# Patient Record
Sex: Female | Born: 1980 | Race: White | Hispanic: No | Marital: Single | Smoking: Former smoker
Health system: Southern US, Community
[De-identification: ages and names within clinical notes are randomized; demographics above are authoritative.]

## PROBLEM LIST (undated history)

## (undated) DIAGNOSIS — R011 Cardiac murmur, unspecified: Secondary | ICD-10-CM

## (undated) DIAGNOSIS — F32A Depression, unspecified: Secondary | ICD-10-CM

## (undated) DIAGNOSIS — K219 Gastro-esophageal reflux disease without esophagitis: Secondary | ICD-10-CM

## (undated) DIAGNOSIS — Z6830 Body mass index (BMI) 30.0-30.9, adult: Secondary | ICD-10-CM

## (undated) DIAGNOSIS — F419 Anxiety disorder, unspecified: Secondary | ICD-10-CM

## (undated) DIAGNOSIS — I10 Essential (primary) hypertension: Secondary | ICD-10-CM

## (undated) DIAGNOSIS — J45909 Unspecified asthma, uncomplicated: Secondary | ICD-10-CM

## (undated) DIAGNOSIS — I219 Acute myocardial infarction, unspecified: Secondary | ICD-10-CM

## (undated) HISTORY — DX: Body mass index (BMI) 30.0-30.9, adult: Z68.30

---
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
## 2001-08-04 ENCOUNTER — Other Ambulatory Visit: Admission: RE | Admit: 2001-08-04 | Discharge: 2001-08-04 | Payer: Self-pay | Admitting: Family Medicine

## 2002-12-23 ENCOUNTER — Other Ambulatory Visit: Admission: RE | Admit: 2002-12-23 | Discharge: 2002-12-23 | Payer: Self-pay | Admitting: Gynecology

## 2004-03-27 ENCOUNTER — Emergency Department (HOSPITAL_COMMUNITY): Admission: EM | Admit: 2004-03-27 | Discharge: 2004-03-27 | Payer: Self-pay | Admitting: Emergency Medicine

## 2004-04-07 ENCOUNTER — Emergency Department (HOSPITAL_COMMUNITY): Admission: EM | Admit: 2004-04-07 | Discharge: 2004-04-07 | Payer: Self-pay

## 2004-04-08 ENCOUNTER — Emergency Department (HOSPITAL_COMMUNITY): Admission: EM | Admit: 2004-04-08 | Discharge: 2004-04-08 | Payer: Self-pay | Admitting: Emergency Medicine

## 2004-05-07 ENCOUNTER — Inpatient Hospital Stay (HOSPITAL_COMMUNITY): Admission: AD | Admit: 2004-05-07 | Discharge: 2004-05-07 | Payer: Self-pay | Admitting: *Deleted

## 2004-05-20 ENCOUNTER — Inpatient Hospital Stay (HOSPITAL_COMMUNITY): Admission: AD | Admit: 2004-05-20 | Discharge: 2004-05-20 | Payer: Self-pay | Admitting: *Deleted

## 2004-05-30 ENCOUNTER — Emergency Department (HOSPITAL_COMMUNITY): Admission: EM | Admit: 2004-05-30 | Discharge: 2004-05-30 | Payer: Self-pay | Admitting: Emergency Medicine

## 2004-06-04 ENCOUNTER — Inpatient Hospital Stay (HOSPITAL_COMMUNITY): Admission: AD | Admit: 2004-06-04 | Discharge: 2004-06-04 | Payer: Self-pay | Admitting: Family Medicine

## 2004-06-06 ENCOUNTER — Inpatient Hospital Stay (HOSPITAL_COMMUNITY): Admission: AD | Admit: 2004-06-06 | Discharge: 2004-06-06 | Payer: Self-pay | Admitting: *Deleted

## 2004-06-10 ENCOUNTER — Inpatient Hospital Stay (HOSPITAL_COMMUNITY): Admission: EM | Admit: 2004-06-10 | Discharge: 2004-06-12 | Payer: Self-pay | Admitting: Emergency Medicine

## 2004-06-10 ENCOUNTER — Emergency Department (HOSPITAL_COMMUNITY): Admission: EM | Admit: 2004-06-10 | Discharge: 2004-06-10 | Payer: Self-pay | Admitting: Emergency Medicine

## 2004-06-12 ENCOUNTER — Other Ambulatory Visit: Admission: RE | Admit: 2004-06-12 | Discharge: 2004-06-12 | Payer: Self-pay | Admitting: Obstetrics & Gynecology

## 2004-09-27 ENCOUNTER — Inpatient Hospital Stay (HOSPITAL_COMMUNITY): Admission: AD | Admit: 2004-09-27 | Discharge: 2004-09-27 | Payer: Self-pay | Admitting: Obstetrics and Gynecology

## 2004-09-27 ENCOUNTER — Other Ambulatory Visit: Admission: RE | Admit: 2004-09-27 | Discharge: 2004-09-27 | Payer: Self-pay | Admitting: Obstetrics and Gynecology

## 2004-12-12 ENCOUNTER — Inpatient Hospital Stay (HOSPITAL_COMMUNITY): Admission: AD | Admit: 2004-12-12 | Discharge: 2004-12-15 | Payer: Self-pay | Admitting: Obstetrics & Gynecology

## 2004-12-13 ENCOUNTER — Encounter (INDEPENDENT_AMBULATORY_CARE_PROVIDER_SITE_OTHER): Payer: Self-pay | Admitting: Specialist

## 2005-10-31 ENCOUNTER — Other Ambulatory Visit: Admission: RE | Admit: 2005-10-31 | Discharge: 2005-10-31 | Payer: Self-pay | Admitting: Obstetrics and Gynecology

## 2006-12-13 ENCOUNTER — Emergency Department (HOSPITAL_COMMUNITY): Admission: EM | Admit: 2006-12-13 | Discharge: 2006-12-13 | Payer: Self-pay | Admitting: Emergency Medicine

## 2009-07-20 ENCOUNTER — Emergency Department (HOSPITAL_COMMUNITY): Admission: EM | Admit: 2009-07-20 | Discharge: 2009-07-20 | Payer: Self-pay | Admitting: Family Medicine

## 2010-09-22 NOTE — Discharge Summary (Signed)
NAME:  Kristi Chambers, Kristi Chambers NO.:  [id number]   MEDICAL RECORD NO.:  [id number]          PATIENT TYPE:  INP   LOCATION:  0372                         FACILITY:  [facility]   PHYSICIAN:  Lonia Blood, M.D.      DATE OF BIRTH:  [birth year]   DATE OF ADMISSION:  06/10/2004  DATE OF DISCHARGE:  06/12/2004                                 DISCHARGE SUMMARY   PRIMARY CARE PHYSICIAN:  The patient is unassigned.   OBSTETRICIAN:  Randye Lobo, M.D.   DISCHARGE DIAGNOSES:  1.  Acute asthmatic exacerbation.  2.  Gastroesophageal reflux disease.  3.  Seasonal allergies.  4.  Complicated second trimester pregnancy with partial placenta previa.   DISCHARGE MEDICATIONS:  1.  Claritin 10 mg p.o. daily.  2.  Steroid taper over the next 10 days.  3.  Prenatal vitamins.  4.  Protonix 40 mg daily.  5.  Advair Diskus 100/50 one puff b.i.d.  6.  Albuterol MDI two puffs b.i.d. p.r.n.   PROCEDURES PERFORMED:  Chest x-ray performed on June 11, 2004 that shows  peribronchial thickening and increased interstitial markings which may  suggest bronchitis or reactive airway disease, but no focal infiltrates.   CONSULTATIONS:  OB-GYN was consulted.   BRIEF HISTORY AND PHYSICAL:  Please refer to dictated history and physical  by Dr. Delilah Shan; In short, however, this is a pleasant 30 year old female  with history of asthma who is also [redacted] weeks pregnant, presenting with acute  asthmatic exacerbation.  The patient's condition is also complicated by the  fact that her pregnancy has recently been found to be complicated with early  and mild placenta previa.  The patient was supposed to be on strict bed  rest.  The patient has been taking only albuterol p.r.n.  She could not  afford previous Advair.  In the emergency room, she was found to be  tachycardic with wheezing significantly bilaterally, as well as tight air  entry.  The patient was also found to be a little bit anxious.  Her labs for  the  most part were stable except for some mild leukocytosis.  She was  subsequently admitted for management of acute asthma exacerbation.   HOSPITAL COURSE:  Problem 1. Acute asthmatic exacerbation.  The patient was  admitted for close observation, started on oxygen via nasal cannula keeping  her saturations between 90% and 93%.  She was also initiated on IV steroids,  in this case Solu-Medrol, which was later changed to prednisone prior to  discharge.  The patient also received some nebulized albuterol and Ativan.  In addition, also she was covered for GERD as well as seasonal allergies.  Extensive discussion with the patient and her husband also was done,  especially since the patient currently lives in a trailer home with  apparently old and new tobacco smoking roommate in the house.  Her partner  and her were informed about the dangers of living in such a vicinity.  As  such, her asthma may have been exacerbated by her environment and the fact  that she has seasonal allergies.  She has  therefore been advised to continue  taking those medications including the Claritin.   Problem 2. Possible gastroesophageal reflux disease.  As indicated, the  patient was placed on Protonix as she will continue to take Protonix at  home.   Problem 3. Seasonal allergies.  The patient will also continue to take  Claritin which was prescribed here in the hospital and she was given a  prescription at home.   Problem 4. Complicated pregnancy.  The patient is on complete bed rest which  was continued in the hospital.  She is to see her obstetrician today after  discharge and to continue with care by Dr. Lyman Bishop.      LG/MEDQ  D:  06/12/2004  T:  06/12/2004  Job:  [id number]

## 2010-09-22 NOTE — H&P (Signed)
NAME:  Kristi Chambers, Kristi Chambers NO.:  [id number]   MEDICAL RECORD NO.:  [id number]          PATIENT TYPE:  EMS   LOCATION:  ED                           FACILITY:  [facility]   PHYSICIAN:  Gertha Calkin, M.D.DATE OF BIRTH:  09-17-1980   DATE OF ADMISSION:  06/10/2004  DATE OF DISCHARGE:                                HISTORY & PHYSICAL   PRIMARY CARE PHYSICIAN:  Unassigned.   PRIMARY OBSTETRICIAN/GYNECOLOGIST:  Randye Lobo, M.D.   CHIEF COMPLAINT:  Acute shortness of breath.   HISTORY OF PRESENT ILLNESS:  This is a pleasant 30 year old Caucasian female  with asthma and [redacted] weeks pregnant who presents with an asthma exacerbation  and was seen earlier today and sent home after receiving a good response  from nebulized breathing treatments and p.o. steroids.  She states that once  she got home within 15-30 minutes she had another exacerbation that did not  respond to the inhaled steroid that she has (albuterol).  She states that  recently she has been having worsening exacerbations.  She does state that  she has recently moved in the past few weeks into a new trailer home whose  previous occupants were heavy smokers.  Otherwise she denies any fevers,  chills, cough productive of sputum.  No sick contacts.   PAST MEDICAL HISTORY:  1.  Questionable history of  reflux disease but on no medications currently.  2.  She also has known asthma.   No surgical history.   FAMILY HISTORY:  No history of DVTs or PEs.  No cardiac issues.  No history  of asthma or cancers.   MEDICATIONS:  Was on Advair but has not taken it for several months, due to  insurance issues.  Now is only taking albuterol on a p.r.n. basis and a  prenatal vitamin.   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   REVIEW OF SYSTEMS:  Negative except other than the HPI.   PHYSICAL EXAMINATION:  VITAL SIGNS:  Temperature  is 98.0, blood pressure  115/66, heart rate of 120, respirations of 26.  She is 92% on room air,  currently on 2 liters nasal cannula she is 98%.  HEENT:  Unremarkable.  CARDIOVASCULAR:  Regular rate and rhythm.  No murmurs, rubs or gallops.  CHEST:  Significant wheezing with tight air sounds.  There is no accessory  muscle use.  She is able to complete full sentences.  (She has never been on  a ventilator, never been hospitalized for pneumonia).  ABDOMEN:  She is distended.  Bowel sounds are present.  No tenderness.  EXTREMITIES:  Without clubbing, cyanosis, or edema.   LABS:  CBC, C-MET, and D-dimmer are pending.  No x-ray done.   ASSESSMENT:  1.  Asthma exacerbation.  2.  Complicated pregnancy (partial placenta previa).  3.  Seasonal allergies.  4.  Deep vein thrombosis prophylaxis.   PLAN:  At this point, given that she has returned to the ED for a second  time in one day  with the same chief complaint, we will admit and use IV  steroids, use a multi-faceted approach to try to treat possible underlying  triggers for her worsening asthma which include increased risk of reflux  secondary to her pregnancy, also new environmental triggers which may be  worsening her usual allergic symptoms, therefore, we will place her on a  PPI, antihistamines, and do scheduled breathing treatments.  Dr. Freida Busman had  spoken with Dr. Edward Jolly who is her OB/GYN, and the physician stated that there  is nothing that needs to be added to her treatment for the asthma  exacerbation and the only thing to check would be a q.a.m. fetal heart  tones.  The OB/GYN physician has stated, per Dr. Freida Busman, that will be  available for any acute issues.  I will be admitting her to a tele bed and  place her on a fetal heart monitor.  IV steroids as stated above.  If there  is any infiltrate noted on x-ray,  we will start her on antibiotics,  otherwise we will treat as an asthma exacerbation.      JD/MEDQ  D:  06/10/2004   T:  06/11/2004  Job:  [id number]   cc:   Randye Lobo, M.D.  [address]  [place]  [place] [address]  Fax: [phone]

## 2011-01-11 ENCOUNTER — Inpatient Hospital Stay (INDEPENDENT_AMBULATORY_CARE_PROVIDER_SITE_OTHER)
Admission: RE | Admit: 2011-01-11 | Discharge: 2011-01-11 | Disposition: A | Discharge status: Home / Self Care | Payer: Self-pay | Source: Ambulatory Visit | Attending: Emergency Medicine | Admitting: Emergency Medicine

## 2011-01-11 DIAGNOSIS — J019 Acute sinusitis, unspecified: Secondary | ICD-10-CM

## 2012-10-03 ENCOUNTER — Emergency Department (HOSPITAL_COMMUNITY)
Admission: EM | Admit: 2012-10-03 | Discharge: 2012-10-03 | Disposition: A | Discharge status: Home / Self Care | Payer: Self-pay | Attending: Emergency Medicine | Admitting: Emergency Medicine

## 2012-10-03 ENCOUNTER — Encounter (HOSPITAL_COMMUNITY): Payer: Self-pay | Admitting: Emergency Medicine

## 2012-10-03 DIAGNOSIS — K648 Other hemorrhoids: Secondary | ICD-10-CM | POA: Insufficient documentation

## 2012-10-03 DIAGNOSIS — K921 Melena: Secondary | ICD-10-CM | POA: Insufficient documentation

## 2012-10-03 DIAGNOSIS — Z79899 Other long term (current) drug therapy: Secondary | ICD-10-CM | POA: Insufficient documentation

## 2012-10-03 DIAGNOSIS — R198 Other specified symptoms and signs involving the digestive system and abdomen: Secondary | ICD-10-CM

## 2012-10-03 DIAGNOSIS — K649 Unspecified hemorrhoids: Secondary | ICD-10-CM

## 2012-10-03 DIAGNOSIS — K644 Residual hemorrhoidal skin tags: Secondary | ICD-10-CM | POA: Insufficient documentation

## 2012-10-03 DIAGNOSIS — K625 Hemorrhage of anus and rectum: Secondary | ICD-10-CM | POA: Insufficient documentation

## 2012-10-03 DIAGNOSIS — K6389 Other specified diseases of intestine: Secondary | ICD-10-CM | POA: Insufficient documentation

## 2012-10-03 LAB — CBC WITH DIFFERENTIAL/PLATELET
Basophils Absolute: 0 10*3/uL (ref 0.0–0.1)
Basophils Relative: 0 % (ref 0–1)
Eosinophils Relative: 5 % (ref 0–5)
HCT: 37.6 % (ref 36.0–46.0)
MCHC: 35.4 g/dL (ref 30.0–36.0)
MCV: 87.9 fL (ref 78.0–100.0)
Monocytes Absolute: 0.6 10*3/uL (ref 0.1–1.0)
RDW: 12.3 % (ref 11.5–15.5)

## 2012-10-03 LAB — BASIC METABOLIC PANEL
Calcium: 9 mg/dL (ref 8.4–10.5)
Creatinine, Ser: 0.56 mg/dL (ref 0.50–1.10)
GFR calc Af Amer: >90 mL/min (ref >90)

## 2012-10-03 LAB — OCCULT BLOOD, POC DEVICE: Fecal Occult Bld: POSITIVE — AB

## 2012-10-03 MED ORDER — PANTOPRAZOLE SODIUM 20 MG PO TBEC: 20.0000 mg | DELAYED_RELEASE_TABLET | Freq: Every day | ORAL | Status: DC

## 2012-10-03 MED ORDER — PANTOPRAZOLE SODIUM 40 MG IV SOLR
40.0000 mg | Freq: Once | INTRAVENOUS | Status: AC
  Administered 2012-10-03: 40 mg via INTRAVENOUS
  Filled 2012-10-03: qty 40

## 2012-10-03 MED ORDER — SODIUM CHLORIDE 0.9 % IV BOLUS (SEPSIS)
1000.0000 mL | Freq: Once | INTRAVENOUS | Status: AC
  Administered 2012-10-03: 1000 mL via INTRAVENOUS

## 2012-10-03 NOTE — ED Provider Notes (Signed)
Medical screening examination/treatment/procedure(s) were performed by non-physician practitioner and as supervising physician I was immediately available for consultation/collaboration.    Celene Kras, MD 10/03/12 [phone]

## 2012-10-03 NOTE — ED Notes (Signed)
Pt states that she has had rectal bleeding since mon dripping amount, no abd pain just pressure, has not had a normal bm,

## 2012-10-03 NOTE — ED Provider Notes (Signed)
History     CSN: [id number]  Arrival date & time 10/03/12  1035   First MD Initiated Contact with Patient 10/03/12 1134      Chief Complaint  Patient presents with  . Rectal Bleeding    (Consider location/radiation/quality/duration/timing/severity/associated sxs/prior treatment) HPI Comments: Pt presents to the ED for rectal bleeding.  [place] and noticed a small amount of blood in her stool.  She has had this happen previously without complications so she did not seek medical attention.  Over the past 2 days she has had increased blood from her rectum without passage of BM.  States she feels it is "pouring out her".  BMs are always irregular- no BM since Monday but denies any straining.  Mother has dx of IBS. Remote hx of small external hemorrhoids.  Has never seen GI in the past.  No hx of anemia.  Denies any chest pain, palpitations, nausea, vomiting, diarrhea, weakness, or dizziness.  Has not tried any meds to help with BMs.  The history is provided by the patient.    History reviewed. No pertinent past medical history.  History reviewed. No pertinent past surgical history.  No family history on file.  History  Substance Use Topics  . Smoking status: Not on file  . Smokeless tobacco: Not on file  . Alcohol Use: Not on file    OB History   Grav Para Term Preterm Abortions TAB SAB Ect Mult Living                  Review of Systems  Gastrointestinal: Positive for blood in stool.  All other systems reviewed and are negative.    Allergies  Review of patient's allergies indicates no known allergies.  Home Medications   Current Outpatient Rx  Name  Route  Sig  Dispense  Refill  . albuterol (PROVENTIL HFA;VENTOLIN HFA) 108 (90 BASE) MCG/ACT inhaler   Inhalation   Inhale 2 puffs into the lungs every 6 (six) hours as needed for wheezing or shortness of breath.         . Aspirin-Salicylamide-Caffeine (BC  HEADACHE POWDER PO)   Oral   Take 1 packet by mouth daily as needed (for headache).         . Multiple Vitamin (MULTIVITAMIN WITH MINERALS) TABS   Oral   Take 1 tablet by mouth daily.         Marland Kitchen venlafaxine XR (EFFEXOR-XR) 150 MG 24 hr capsule   Oral   Take 150 mg by mouth at bedtime.           BP 136/68  Pulse 91  Temp(Src) 98.6 F (37 C) (Oral)  Resp 16  Ht 5\' 7"  (1.702 m)  Wt 162 lb (73.483 kg)  BMI 25.37 kg/m2  SpO2 99%  Physical Exam  Nursing note and vitals reviewed. Constitutional: She is oriented to person, place, and time. She appears well-developed and well-nourished. No distress.  HENT:  Head: Normocephalic and atraumatic.  Mouth/Throat: Oropharynx is clear and moist.  Eyes: Conjunctivae and EOM are normal.  Neck: Normal range of motion. Neck supple.  Cardiovascular: Normal rate, regular rhythm and normal heart sounds.   Pulmonary/Chest: Effort normal and breath sounds normal. No respiratory distress.  Abdominal: Soft. Bowel sounds are normal. There is no tenderness. There is no guarding.  Genitourinary: Rectal exam shows external hemorrhoid, internal hemorrhoid and tenderness. Rectal exam shows no fissure, no mass  and anal tone normal. Guaiac positive stool.  Non-thrombosed external hemorrhoids surrounding rectum, internal hemorrhoids TTP, small amount of blood noted, normal rectal tone  Musculoskeletal: Normal range of motion.  Neurological: She is alert and oriented to person, place, and time. She has normal strength. No cranial nerve deficit or sensory deficit. Gait normal.  Skin: Skin is warm and dry. She is not diaphoretic.  Psychiatric: She has a normal mood and affect.    ED Course  Procedures (including critical care time)  Labs Reviewed  OCCULT BLOOD, POC DEVICE - Abnormal; Notable for the following:    Fecal Occult Bld POSITIVE (*)    All other components within normal limits  CBC WITH DIFFERENTIAL  BASIC METABOLIC PANEL   No results  found.   1. Rectal bleeding   2. Irregular bowel habits   3. Hemorrhoids       MDM   32 y.o. F presenting to the ED for rectal bleeding.  States irregular BMs as long as she can remember.  Bleeding increased over the past 2 days.  No weakness, palpitations, or dizziness.  Guaiac positive.  H/H stable- no signs/sx of anemia.  DRE with external and internal hemorrhoids- possible source of bleeding.  IVF and protonix given in the ED.  Rx protonix. May wish to try OTC stool softener to help pass BM. FU with GI.  Discussed plan with pt, she agreed.  Return precautions advised.        Garlon Hatchet, PA-C 10/03/12 1420  Garlon Hatchet, PA-C 10/03/12 1423

## 2013-06-08 ENCOUNTER — Emergency Department (HOSPITAL_COMMUNITY): Payer: Self-pay

## 2013-06-08 ENCOUNTER — Encounter (HOSPITAL_COMMUNITY): Payer: Self-pay | Admitting: Emergency Medicine

## 2013-06-08 ENCOUNTER — Emergency Department (HOSPITAL_COMMUNITY)
Admission: EM | Admit: 2013-06-08 | Discharge: 2013-06-08 | Disposition: A | Discharge status: Home / Self Care | Payer: Self-pay | Attending: Emergency Medicine | Admitting: Emergency Medicine

## 2013-06-08 DIAGNOSIS — F17200 Nicotine dependence, unspecified, uncomplicated: Secondary | ICD-10-CM | POA: Insufficient documentation

## 2013-06-08 DIAGNOSIS — IMO0002 Reserved for concepts with insufficient information to code with codable children: Secondary | ICD-10-CM | POA: Insufficient documentation

## 2013-06-08 DIAGNOSIS — Z79899 Other long term (current) drug therapy: Secondary | ICD-10-CM | POA: Insufficient documentation

## 2013-06-08 DIAGNOSIS — J45901 Unspecified asthma with (acute) exacerbation: Secondary | ICD-10-CM | POA: Insufficient documentation

## 2013-06-08 HISTORY — DX: Unspecified asthma, uncomplicated: J45.909

## 2013-06-08 LAB — CBC
HEMATOCRIT: 37.2 % (ref 36.0–46.0)
HEMOGLOBIN: 13.1 g/dL (ref 12.0–15.0)
MCH: 31 pg (ref 26.0–34.0)
MCHC: 35.2 g/dL (ref 30.0–36.0)
MCV: 88.2 fL (ref 78.0–100.0)
Platelets: 277 10*3/uL (ref 150–400)
RBC: 4.22 MIL/uL (ref 3.87–5.11)
RDW: 12.3 % (ref 11.5–15.5)
WBC: 8.9 10*3/uL (ref 4.0–10.5)

## 2013-06-08 LAB — BASIC METABOLIC PANEL
BUN: 9 mg/dL (ref 6–23)
CHLORIDE: 101 meq/L (ref 96–112)
CO2: 26 meq/L (ref 19–32)
Calcium: 9.8 mg/dL (ref 8.4–10.5)
Creatinine, Ser: 0.78 mg/dL (ref 0.50–1.10)
GFR calc Af Amer: >90 mL/min (ref >90)
GLUCOSE: 93 mg/dL (ref 70–99)
POTASSIUM: 4.8 meq/L (ref 3.7–5.3)
SODIUM: 139 meq/L (ref 137–147)

## 2013-06-08 MED ORDER — IPRATROPIUM BROMIDE 0.02 % IN SOLN
0.5000 mg | Freq: Once | RESPIRATORY_TRACT | Status: AC
  Administered 2013-06-08: 0.5 mg via RESPIRATORY_TRACT
  Filled 2013-06-08: qty 2.5

## 2013-06-08 MED ORDER — ALBUTEROL SULFATE HFA 108 (90 BASE) MCG/ACT IN AERS
2.0000 | INHALATION_SPRAY | Freq: Once | RESPIRATORY_TRACT | Status: AC
  Administered 2013-06-08: 2 via RESPIRATORY_TRACT
  Filled 2013-06-08: qty 6.7

## 2013-06-08 MED ORDER — PREDNISONE 10 MG PO TABS: ORAL_TABLET | ORAL | Status: DC

## 2013-06-08 MED ORDER — PREDNISONE 20 MG PO TABS
60.0000 mg | ORAL_TABLET | Freq: Once | ORAL | Status: AC
  Administered 2013-06-08: 60 mg via ORAL
  Filled 2013-06-08: qty 3

## 2013-06-08 MED ORDER — ALBUTEROL SULFATE (2.5 MG/3ML) 0.083% IN NEBU
5.0000 mg | INHALATION_SOLUTION | Freq: Once | RESPIRATORY_TRACT | Status: AC
  Administered 2013-06-08: 5 mg via RESPIRATORY_TRACT
  Filled 2013-06-08: qty 6

## 2013-06-08 NOTE — ED Notes (Signed)
Pt ambulates without distress. Respirations easy non labored. 

## 2013-06-08 NOTE — ED Notes (Signed)
Pt reports having trouble breathing for 2 months. Pt states that she feels she is wheezing and that her chest is tight. Pt has used plumicort and albuterol to help treat symptoms. Recently used meds before coming to ED with some relief. Sats 96% on RA. No wheezing heard in lung fields.

## 2013-06-08 NOTE — Discharge Instructions (Signed)
Take prednisone as prescribed until all gone. Use inhaler, two puffs every 4 hrs as needed. Avoid triggers. Follow up with a primary care doctor.     Asthma, Adult Asthma is a recurring condition in which the airways tighten and narrow. Asthma can make it difficult to breathe. It can cause coughing, wheezing, and shortness of breath. Asthma episodes (also called asthma attacks) range from minor to life-threatening. Asthma cannot be cured, but medicines and lifestyle changes can help control it. CAUSES Asthma is believed to be caused by inherited (genetic) and environmental factors, but its exact cause is unknown. Asthma may be triggered by allergens, lung infections, or irritants in the air. Asthma triggers are different for each person. Common triggers include:   Animal dander.  Dust mites.  Cockroaches.  Pollen from trees or grass.  Mold.  Smoke.  Air pollutants such as dust, household cleaners, hair sprays, aerosol sprays, paint fumes, strong chemicals, or strong odors.  Cold air, weather changes, and winds (which increase molds and pollens in the air).  Strong emotional expressions such as crying or laughing hard.  Stress.  Certain medicines (such as aspirin) or types of drugs (such as beta-blockers).  Sulfites in foods and drinks. Foods and drinks that may contain sulfites include dried fruit, potato chips, and sparkling grape juice.  Infections or inflammatory conditions such as the flu, a cold, or an inflammation of the nasal membranes (rhinitis).  Gastroesophageal reflux disease (GERD).  Exercise or strenuous activity. SYMPTOMS Symptoms may occur immediately after asthma is triggered or many hours later. Symptoms include:  Wheezing.  Excessive nighttime or early morning coughing.  Frequent or severe coughing with a common cold.  Chest tightness.  Shortness of breath. DIAGNOSIS  The diagnosis of asthma is made by a review of your medical history and a  physical exam. Tests may also be performed. These may include:  Lung function studies. These tests show how much air you breath in and out.  Allergy tests.  Imaging tests such as X-rays. TREATMENT  Asthma cannot be cured, but it can usually be controlled. Treatment involves identifying and avoiding your asthma triggers. It also involves medicines. There are 2 classes of medicine used for asthma treatment:   Controller medicines. These prevent asthma symptoms from occurring. They are usually taken every day.  Reliever or rescue medicines. These quickly relieve asthma symptoms. They are used as needed and provide short-term relief. Your health care provider will help you create an asthma action plan. An asthma action plan is a written plan for managing and treating your asthma attacks. It includes a list of your asthma triggers and how they may be avoided. It also includes information on when medicines should be taken and when their dosage should be changed. An action plan may also involve the use of a device called a peak flow meter. A peak flow meter measures how well the lungs are working. It helps you monitor your condition. HOME CARE INSTRUCTIONS   Take medicine as directed by your health care provider. Speak with your health care provider if you have questions about how or when to take the medicines.  Use a peak flow meter as directed by your health care provider. Record and keep track of readings.  Understand and use the action plan to help minimize or stop an asthma attack without needing to seek medical care.  Control your home environment in the following ways to help prevent asthma attacks:  Do not smoke. Avoid being exposed to  secondhand smoke.  Change your heating and air conditioning filter regularly.  Limit your use of fireplaces and wood stoves.  Get rid of pests (such as roaches and mice) and their droppings.  Throw away plants if you see mold on them.  Clean your  floors and dust regularly. Use unscented cleaning products.  Try to have someone else vacuum for you regularly. Stay out of rooms while they are being vacuumed and for a short while afterward. If you vacuum, use a dust mask from a hardware store, a double-layered or microfilter vacuum cleaner bag, or a vacuum cleaner with a HEPA filter.  Replace carpet with wood, tile, or vinyl flooring. Carpet can trap dander and dust.  Use allergy-proof pillows, mattress covers, and box spring covers.  Wash bed sheets and blankets every week in hot water and dry them in a dryer.  Use blankets that are made of polyester or cotton.  Clean bathrooms and kitchens with bleach. If possible, have someone repaint the walls in these rooms with mold-resistant paint. Keep out of the rooms that are being cleaned and painted.  Wash hands frequently. SEEK MEDICAL CARE IF:   You have wheezing, shortness of breath, or a cough even if taking medicine to prevent attacks.  The colored mucus you cough up (sputum) is thicker than usual.  Your sputum changes from clear or white to yellow, green, gray, or bloody.  You have any problems that may be related to the medicines you are taking (such as a rash, itching, swelling, or trouble breathing).  You are using a reliever medicine more than 2 3 times per week.  Your peak flow is still at 50 79% of you personal best after following your action plan for 1 hour. SEEK IMMEDIATE MEDICAL CARE IF:   You seem to be getting worse and are unresponsive to treatment during an asthma attack.  You are short of breath even at rest.  You get short of breath when doing very little physical activity.  You have difficulty eating, drinking, or talking due to asthma symptoms.  You develop chest pain.  You develop a fast heartbeat.  You have a bluish color to your lips or fingernails.  You are lightheaded, dizzy, or faint.  Your peak flow is less than 50% of your personal  best.  You have a fever or persistent symptoms for more than 2 3 days.  You have a fever and symptoms suddenly get worse. MAKE SURE YOU:   Understand these instructions.  Will watch your condition.  Will get help right away if you are not doing well or get worse. Document Released: 04/23/2005 Document Revised: 12/24/2012 Document Reviewed: 11/20/2012 [facility] Patient Information 2014 [place], [place].  Asthma Attack Prevention Although there is no way to prevent asthma from starting, you can take steps to control the disease and reduce its symptoms. Learn about your asthma and how to control it. Take an active role to control your asthma by working with your health care provider to create and follow an asthma action plan. An asthma action plan guides you in:  Taking your medicines properly.  Avoiding things that set off your asthma or make your asthma worse (asthma triggers).  Tracking your level of asthma control.  Responding to worsening asthma.  Seeking emergency care when needed. To track your asthma, keep records of your symptoms, check your peak flow number using a handheld device that shows how well air moves out of your lungs (peak flow meter), and get  regular asthma checkups.  WHAT ARE SOME WAYS TO PREVENT AN ASTHMA ATTACK?  Take medicines as directed by your health care provider.  Keep track of your asthma symptoms and level of control.  With your health care provider, write a detailed plan for taking medicines and managing an asthma attack. Then be sure to follow your action plan. Asthma is an ongoing condition that needs regular monitoring and treatment.  Identify and avoid asthma triggers. Many outdoor allergens and irritants (such as pollen, mold, cold air, and air pollution) can trigger asthma attacks. Find out what your asthma triggers are and take steps to avoid them.  Monitor your breathing. Learn to recognize warning signs of an attack, such as coughing,  wheezing, or shortness of breath. Your lung function may decrease before you notice any signs or symptoms, so regularly measure and record your peak airflow with a home peak flow meter.  Identify and treat attacks early. If you act quickly, you are less likely to have a severe attack. You will also need less medicine to control your symptoms. When your peak flow measurements decrease and alert you to an upcoming attack, take your medicine as instructed and immediately stop any activity that may have triggered the attack. If your symptoms do not improve, get medical help.  Pay attention to increasing quick-relief inhaler use. If you find yourself relying on your quick-relief inhaler, your asthma is not under control. See your health care provider about adjusting your treatment. WHAT CAN MAKE MY SYMPTOMS WORSE? A number of common things can set off or make your asthma symptoms worse and cause temporary increased inflammation of your airways. Keep track of your asthma symptoms for several weeks, detailing all the environmental and emotional factors that are linked with your asthma. When you have an asthma attack, go back to your asthma diary to see which factor, or combination of factors, might have contributed to it. Once you know what these factors are, you can take steps to control many of them. If you have allergies and asthma, it is important to take asthma prevention steps at home. Minimizing contact with the substance to which you are allergic will help prevent an asthma attack. Some triggers and ways to avoid these triggers are: Animal Dander:  Some people are allergic to the flakes of skin or dried saliva from animals with fur or feathers.   There is no such thing as a hypoallergenic dog or cat breed. All dogs or cats can cause allergies, even if they don't shed.  Keep these pets out of your home.  If you are not able to keep a pet outdoors, keep the pet out of your bedroom and other sleeping  areas at all times, and keep the door closed.  Remove carpets and furniture covered with cloth from your home. If that is not possible, keep the pet away from fabric-covered furniture and carpets. Dust Mites: Many people with asthma are allergic to dust mites. Dust mites are tiny bugs that are found in every home in mattresses, pillows, carpets, fabric-covered furniture, bedcovers, clothes, stuffed toys, and other fabric-covered items.   Cover your mattress in a special dust-proof cover.  Cover your pillow in a special dust-proof cover, or wash the pillow each week in hot water. Water must be hotter than 130 F (54.4 C) to kill dust mites. Cold or warm water used with detergent and bleach can also be effective.  Wash the sheets and blankets on your bed each week in hot  water.  Try not to sleep or lie on cloth-covered cushions.  Call ahead when traveling and ask for a smoke-free hotel room. Bring your own bedding and pillows in case the hotel only supplies feather pillows and down comforters, which may contain dust mites and cause asthma symptoms.  Remove carpets from your bedroom and those laid on concrete, if you can.  Keep stuffed toys out of the bed, or wash the toys weekly in hot water or cooler water with detergent and bleach. Cockroaches: Many people with asthma are allergic to the droppings and remains of cockroaches.   Keep food and garbage in closed containers. Never leave food out.  Use poison baits, traps, powders, gels, or paste (for example, boric acid).  If a spray is used to kill cockroaches, stay out of the room until the odor goes away. Indoor Mold:  Fix leaky faucets, pipes, or other sources of water that have mold around them.  Clean floors and moldy surfaces with a fungicide or diluted bleach.  Avoid using humidifiers, vaporizers, or swamp coolers. These can spread molds through the air. Pollen and Outdoor Mold:  When pollen or mold spore counts are high, try  to keep your windows closed.  Stay indoors with windows closed from late morning to afternoon. Pollen and some mold spore counts are highest at that time.  Ask your health care provider whether you need to take anti-inflammatory medicine or increase your dose of the medicine before your allergy season starts. Other Irritants to Avoid:  Tobacco smoke is an irritant. If you smoke, ask your health care provider how you can quit. Ask family members to quit smoking too. Do not allow smoking in your home or car.  If possible, do not use a wood-burning stove, kerosene heater, or fireplace. Minimize exposure to all sources of smoke, including to incense, candles, fires, and fireworks.  Try to stay away from strong odors and sprays, such as perfume, talcum powder, hair spray, and paints.  Decrease humidity in your home and use an indoor air cleaning device. Reduce indoor humidity to below 60%. Dehumidifiers or central air conditioners can do this.  Decrease house dust exposure by changing furnace and air cooler filters frequently.  Try to have someone else vacuum for you once or twice a week. Stay out of rooms while they are being vacuumed and for a short while afterward.  If you vacuum, use a dust mask from a hardware store, a double-layered or microfilter vacuum cleaner bag, or a vacuum cleaner with a HEPA filter.  Sulfites in foods and beverages can be irritants. Do not drink beer or wine or eat dried fruit, processed potatoes, or shrimp if they cause asthma symptoms.  Cold air can trigger an asthma attack. Cover your nose and mouth with a scarf on cold or windy days.  Several health conditions can make asthma more difficult to manage, including a runny nose, sinus infections, reflux disease, psychological stress, and sleep apnea. Work with your health care provider to manage these conditions.  Avoid close contact with people who have a respiratory infection such as a cold or the flu, since your  asthma symptoms may get worse if you catch the infection. Wash your hands thoroughly after touching items that may have been handled by people with a respiratory infection.  Get a flu shot every year to protect against the flu virus, which often makes asthma worse for days or weeks. Also get a pneumonia shot if you have not previously  had one. Unlike the flu shot, the pneumonia shot does not need to be given yearly. Medicines:  Talk to your health care provider about whether it is safe for you to take aspirin or non-steroidal anti-inflammatory medicines (NSAIDs). In a small number of people with asthma, aspirin and NSAIDs can cause asthma attacks. These medicines must be avoided by people who have known aspirin-sensitive asthma. It is important that people with aspirin-sensitive asthma read labels of all over-the-counter medicines used to treat pain, colds, coughs, and fever.  Beta blockers and ACE inhibitors are other medicines you should discuss with your health care provider. HOW CAN I FIND OUT WHAT I AM ALLERGIC TO? Ask your asthma health care provider about allergy skin testing or blood testing (the RAST test) to identify the allergens to which you are sensitive. If you are found to have allergies, the most important thing to do is to try to avoid exposure to any allergens that you are sensitive to as much as possible. Other treatments for allergies, such as medicines and allergy shots (immunotherapy) are available.  CAN I EXERCISE? Follow your health care provider's advice regarding asthma treatment before exercising. It is important to maintain a regular exercise program, but vigorous exercise, or exercise in cold, humid, or dry environments can cause asthma attacks, especially for those people who have exercise-induced asthma. Document Released: 04/11/2009 Document Revised: 12/24/2012 Document Reviewed: 10/29/2012 [facility] Patient Information 2014 [place], [place].

## 2013-06-08 NOTE — ED Provider Notes (Signed)
CSN: [id number]     Arrival date & time 06/08/13  1902 History   First MD Initiated Contact with Patient 06/08/13 2018     Chief Complaint  Patient presents with  . Asthma   (Consider location/radiation/quality/duration/timing/severity/associated sxs/prior Treatment) HPI Kristi Chambers is a 33 y.o. female who presents to emergency department complaining of chest tightness and shortness of breath for 2 months. She states she has history of asthma. States she is a smoker but she has not smoked in 2 months. She states that she does have constant smoke exposure in the house, states her significant other smokes inside. She states she has been using her friend's child's prescription for prednisone and inhaler and nebulizer machine at home. She's not sure what dosage of prednisone she took, states lasted take it was month and a half ago. She also states she has been doing breathing treatments once in the morning one at evening time. She states she currently does not have insurance and  unable to follow up with her primary care Dr. She denies any fever, chills. She denies any other upper respiratory tract symptoms. She states she's having a dry cough with no productive sputum. She states she feels chest tightness. She states she wakes up at night with difficulty breathing. She denies any recent travel or surgeries. She denies any current chest pain. Past Medical History  Diagnosis Date  . Asthma    History reviewed. No pertinent past surgical history. No family history on file. History  Substance Use Topics  . Smoking status: Current Every Day Smoker -- 0.50 packs/day  . Smokeless tobacco: Not on file  . Alcohol Use: Yes   OB History   Grav Para Term Preterm Abortions TAB SAB Ect Mult Living                 Review of Systems  Constitutional: Negative for fever and chills.  HENT: Negative for congestion, ear pain and sore throat.   Respiratory: Positive for cough, shortness of breath and  wheezing. Negative for chest tightness.   Cardiovascular: Negative for chest pain, palpitations and leg swelling.  Gastrointestinal: Negative for nausea, vomiting, abdominal pain and diarrhea.  Genitourinary: Negative for dysuria, flank pain and pelvic pain.  Musculoskeletal: Negative for arthralgias, myalgias, neck pain and neck stiffness.  Skin: Negative for rash.  Neurological: Negative for dizziness, weakness and headaches.  All other systems reviewed and are negative.    Allergies  Review of patient's allergies indicates no known allergies.  Home Medications   Current Outpatient Rx  Name  Route  Sig  Dispense  Refill  . albuterol (PROVENTIL HFA;VENTOLIN HFA) 108 (90 BASE) MCG/ACT inhaler   Inhalation   Inhale 2 puffs into the lungs every 6 (six) hours as needed for wheezing or shortness of breath.         . budesonide (PULMICORT) 0.25 MG/2ML nebulizer solution   Nebulization   Take 0.25 mg by nebulization 2 (two) times daily.         Marland Kitchen. venlafaxine XR (EFFEXOR-XR) 150 MG 24 hr capsule   Oral   Take 150 mg by mouth at bedtime.          BP 142/88  Pulse 78  Temp(Src) 98.3 F (36.8 C) (Oral)  Resp 20  Ht 5\' 7"  (1.702 m)  Wt 185 lb (83.915 kg)  BMI 28.97 kg/m2  SpO2 97% Physical Exam  Nursing note and vitals reviewed. Constitutional: She is oriented to person, place, and time. She  appears well-developed and well-nourished. No distress.  HENT:  Head: Normocephalic.  Right Ear: External ear normal.  Left Ear: External ear normal.  Nose: Nose normal.  Mouth/Throat: Oropharynx is clear and moist.  Eyes: Conjunctivae are normal.  Neck: Neck supple.  Cardiovascular: Normal rate, regular rhythm and normal heart sounds.   Pulmonary/Chest: Effort normal. No respiratory distress. She has wheezes. She has no rales.  End expiratory wheezes in all lung fields  Musculoskeletal: She exhibits no edema.  Neurological: She is alert and oriented to person, place, and time.   Skin: Skin is warm and dry.  Psychiatric: She has a normal mood and affect. Her behavior is normal.    ED Course  Procedures (including critical care time) Labs Review Labs Reviewed  BASIC METABOLIC PANEL  CBC   Imaging Review Dg Chest 2 View (if Patient Has Fever And/or Copd)  06/08/2013   CLINICAL DATA:  Chest pain.  Shortness of breath.  EXAM: CHEST  2 VIEW  COMPARISON:  DG CHEST 2V dated 01/25/2009; DG CHEST 2 VIEW dated 06/11/2004  FINDINGS: Cardiomediastinal silhouette unremarkable and unchanged. Lungs clear. Bronchovascular markings normal. Pulmonary vascularity normal. No visible pleural effusions. No pneumothorax. Visualized bony thorax intact. Resolution of central peribronchial thickening noted on prior examinations.  IMPRESSION: Normal examination.   Electronically Signed   By: Hulan Saas M.D.   On: 06/08/2013 19:45    EKG Interpretation    Date/Time:  Monday June 08 2013 19:40:07 EST Ventricular Rate:  77 PR Interval:  152 QRS Duration: 85 QT Interval:  413 QTC Calculation: 467 R Axis:   67 Text Interpretation:  Sinus rhythm Normal ECG Compared to previous tracing Rate slower Confirmed by [facility]  MD, JOHN ([id number]) on 06/08/2013 7:49:48 PM            MDM   1. Asthma exacerbation      Patient with history of asthma. Here with wheezing, shortness of breath for 2 months. Chest x-ray unremarkable. Labs are normal. Suspect patient's asthma strip by insight smoker and possibly cold weather. Her oxygen saturation is above 96% on room air. She appears to be comfortable, no accessory muscle use, speaking full sentences. She received 2 treatments in emergency department. Will start her on a short taper of prednisone. She states her insurance will kick in next month. She is instructed to follow with her primary care doctor and avoid smoke. Also discussed smoking cessation.  Filed Vitals:   06/08/13 1924 06/08/13 1925  BP: 142/88   Pulse: 78   Temp: 98.3 F (36.8  C)   TempSrc: Oral   Resp: 20   Height: 5\' 7"  (1.702 m)   Weight: 185 lb (83.915 kg)   SpO2: 96% 97%     Lottie Mussel, PA-C 06/08/13 2208

## 2013-06-12 NOTE — ED Provider Notes (Signed)
Medical screening examination/treatment/procedure(s) were performed by non-physician practitioner and as supervising physician I was immediately available for consultation/collaboration.  Zareena Willis M Jaye Polidori, MD 06/12/13 1951 

## 2014-10-05 ENCOUNTER — Encounter (HOSPITAL_COMMUNITY): Payer: Self-pay | Admitting: Physical Medicine and Rehabilitation

## 2014-10-05 ENCOUNTER — Emergency Department (HOSPITAL_COMMUNITY)
Admission: EM | Admit: 2014-10-05 | Discharge: 2014-10-05 | Disposition: A | Discharge status: Home / Self Care | Payer: Medicaid Other | Attending: Emergency Medicine | Admitting: Emergency Medicine

## 2014-10-05 DIAGNOSIS — S3992XA Unspecified injury of lower back, initial encounter: Secondary | ICD-10-CM | POA: Diagnosis present

## 2014-10-05 DIAGNOSIS — Y9389 Activity, other specified: Secondary | ICD-10-CM | POA: Diagnosis not present

## 2014-10-05 DIAGNOSIS — X58XXXA Exposure to other specified factors, initial encounter: Secondary | ICD-10-CM | POA: Diagnosis not present

## 2014-10-05 DIAGNOSIS — Y998 Other external cause status: Secondary | ICD-10-CM | POA: Insufficient documentation

## 2014-10-05 DIAGNOSIS — Y9289 Other specified places as the place of occurrence of the external cause: Secondary | ICD-10-CM | POA: Insufficient documentation

## 2014-10-05 DIAGNOSIS — Z87891 Personal history of nicotine dependence: Secondary | ICD-10-CM | POA: Insufficient documentation

## 2014-10-05 DIAGNOSIS — Z79899 Other long term (current) drug therapy: Secondary | ICD-10-CM | POA: Insufficient documentation

## 2014-10-05 DIAGNOSIS — J45909 Unspecified asthma, uncomplicated: Secondary | ICD-10-CM | POA: Insufficient documentation

## 2014-10-05 DIAGNOSIS — S39012A Strain of muscle, fascia and tendon of lower back, initial encounter: Secondary | ICD-10-CM | POA: Insufficient documentation

## 2014-10-05 MED ORDER — KETOROLAC TROMETHAMINE 60 MG/2ML IM SOLN
60.0000 mg | Freq: Once | INTRAMUSCULAR | Status: AC
  Administered 2014-10-05: 60 mg via INTRAMUSCULAR
  Filled 2014-10-05: qty 2

## 2014-10-05 MED ORDER — CYCLOBENZAPRINE HCL 10 MG PO TABS
10.0000 mg | ORAL_TABLET | Freq: Once | ORAL | Status: AC
  Administered 2014-10-05: 10 mg via ORAL
  Filled 2014-10-05: qty 1

## 2014-10-05 MED ORDER — MELOXICAM 15 MG PO TABS: 15.0000 mg | ORAL_TABLET | Freq: Every day | ORAL | Status: DC

## 2014-10-05 MED ORDER — METHOCARBAMOL 500 MG PO TABS: 500.0000 mg | ORAL_TABLET | Freq: Two times a day (BID) | ORAL | Status: DC

## 2014-10-05 NOTE — ED Notes (Signed)
Pt is in stable condition upon d/c and ambulates from ED. 

## 2014-10-05 NOTE — ED Notes (Signed)
Pt states her back feels better standing up. Pt also states that while laying down it is more painful to roll over to the right than the left. Pt is currently sitting upright on the end of the exam chair. Pt feels as if she might have pulled while trying to "scooch after her dog".

## 2014-10-05 NOTE — ED Provider Notes (Signed)
CSN: [id number]     Arrival date & time 10/05/14  0855 History  This chart was scribed for non-physician practitioner, Emilia Beck, PA-C, working with Rolland Porter, MD, by Lionel December, ED Scribe. This patient was seen in room TR08C/TR08C and the patient's care was started at 9:49 AM.    Chief Complaint  Patient presents with  . Back Pain     (Consider location/radiation/quality/duration/timing/severity/associated sxs/prior Treatment) Patient is a 34 y.o. female presenting with back pain. The history is provided by the patient. No language interpreter was used.  Back Pain Location:  Lumbar spine Quality:  Aching Radiates to:  Does not radiate Pain severity:  Severe Pain is:  Same all the time Onset quality:  Sudden Timing:  Constant Progression:  Unchanged Chronicity:  New   HPI Comments: Kristi Chambers is a 34 y.o. female who presents to the Emergency Department complaining of constant sore back pain onset last night when she was scooting on the floor while playing with her dog.  She states that the pain is worse when she is in certain positions. Denies any bowel/bladder incontinence, precious injuries, difficulty ambulating. She has no other complaints today.    Past Medical History  Diagnosis Date  . Asthma    History reviewed. No pertinent past surgical history. No family history on file. History  Substance Use Topics  . Smoking status: Former Smoker -- 0.50 packs/day  . Smokeless tobacco: Not on file  . Alcohol Use: Yes   OB History    No data available     Review of Systems  Musculoskeletal: Positive for back pain.  All other systems reviewed and are negative.     Allergies  Review of patient's allergies indicates no known allergies.  Home Medications   Prior to Admission medications   Medication Sig Start Date End Date Taking? Authorizing Provider  albuterol (PROVENTIL HFA;VENTOLIN HFA) 108 (90 BASE) MCG/ACT inhaler Inhale 2 puffs into the  lungs every 6 (six) hours as needed for wheezing or shortness of breath.    Historical Provider, MD  budesonide (PULMICORT) 0.25 MG/2ML nebulizer solution Take 0.25 mg by nebulization 2 (two) times daily.    Historical Provider, MD  predniSONE (DELTASONE) 10 MG tablet Take 5 tab day 1, take 4 tab day 2, take 3 tab day 3, take 2 tab day 4, and take 1 tab day 5 06/08/13   Tatyana Kirichenko, PA-C  venlafaxine XR (EFFEXOR-XR) 150 MG 24 hr capsule Take 150 mg by mouth at bedtime.    Historical Provider, MD   BP 121/73 mmHg  Pulse 89  Temp(Src) 97.7 F (36.5 C) (Oral)  Resp 18  SpO2 99% Physical Exam  Constitutional: She is oriented to person, place, and time. She appears well-developed and well-nourished. No distress.  HENT:  Head: Normocephalic and atraumatic.  Eyes: Conjunctivae are normal.  Cardiovascular: Normal rate and regular rhythm.  Exam reveals no gallop and no friction rub.   No murmur heard. Pulmonary/Chest: Effort normal and breath sounds normal. No respiratory distress. She has no wheezes. She has no rales. She exhibits no tenderness.  Abdominal: Soft. There is no tenderness.  Musculoskeletal: Normal range of motion.  Neurological: She is alert and oriented to person, place, and time.  Speech is goal-oriented. Moves limbs without ataxia.   Skin: Skin is warm and dry.  Psychiatric: She has a normal mood and affect. Her behavior is normal.  Nursing note and vitals reviewed.   ED Course  Procedures (including critical care  time) DIAGNOSTIC STUDIES: Oxygen Saturation is 99% on RA, normal by my interpretation.    COORDINATION OF CARE: 9:52 AM Discussed treatment plan with patient at beside, the patient agrees with the plan and has no further questions at this time.   Labs Review Labs Reviewed - No data to display  Imaging Review No results found.   EKG Interpretation None      MDM   Final diagnoses:  Strain of lumbar paraspinal muscle, initial encounter    Patient likely have a lumbar muscle strain. No bladder/bowel incontinence or saddle paresthesias.   I personally performed the services described in this documentation, which was scribed in my presence. The recorded information has been reviewed and is accurate.    Emilia BeckKaitlyn Sicily Zaragoza, PA-C 10/05/14 1037  Rolland PorterMark James, MD 10/19/14 [phone]

## 2014-10-05 NOTE — Discharge Instructions (Signed)
Take mobic as directed for pain. Take Robaxin as needed for muscle spasm. You may take these together. Refer to attached documents for more information.

## 2014-10-05 NOTE — ED Notes (Signed)
Pt states she injured back last night while brushing dog, now states 8/10 lower back pain. NAD.

## 2017-05-03 ENCOUNTER — Encounter (HOSPITAL_COMMUNITY): Payer: Self-pay | Admitting: Emergency Medicine

## 2017-05-03 ENCOUNTER — Inpatient Hospital Stay (HOSPITAL_COMMUNITY)
Admission: EM | Admit: 2017-05-03 | Discharge: 2017-05-05 | DRG: 280 | Disposition: A | Discharge status: Home / Self Care | Payer: Medicaid Other | Attending: Cardiovascular Disease | Admitting: Cardiovascular Disease

## 2017-05-03 ENCOUNTER — Encounter (HOSPITAL_COMMUNITY)
Admission: EM | Disposition: A | Discharge status: Home / Self Care | Payer: Self-pay | Source: Home / Self Care | Attending: Cardiovascular Disease

## 2017-05-03 ENCOUNTER — Emergency Department (HOSPITAL_COMMUNITY): Payer: Medicaid Other

## 2017-05-03 DIAGNOSIS — F988 Other specified behavioral and emotional disorders with onset usually occurring in childhood and adolescence: Secondary | ICD-10-CM | POA: Diagnosis present

## 2017-05-03 DIAGNOSIS — R748 Abnormal levels of other serum enzymes: Secondary | ICD-10-CM | POA: Diagnosis not present

## 2017-05-03 DIAGNOSIS — I213 ST elevation (STEMI) myocardial infarction of unspecified site: Principal | ICD-10-CM | POA: Diagnosis present

## 2017-05-03 DIAGNOSIS — Z87891 Personal history of nicotine dependence: Secondary | ICD-10-CM | POA: Diagnosis not present

## 2017-05-03 DIAGNOSIS — J45909 Unspecified asthma, uncomplicated: Secondary | ICD-10-CM | POA: Diagnosis present

## 2017-05-03 DIAGNOSIS — I2542 Coronary artery dissection: Secondary | ICD-10-CM | POA: Diagnosis present

## 2017-05-03 DIAGNOSIS — R079 Chest pain, unspecified: Secondary | ICD-10-CM

## 2017-05-03 DIAGNOSIS — E785 Hyperlipidemia, unspecified: Secondary | ICD-10-CM | POA: Diagnosis present

## 2017-05-03 DIAGNOSIS — I10 Essential (primary) hypertension: Secondary | ICD-10-CM | POA: Diagnosis present

## 2017-05-03 DIAGNOSIS — Z8709 Personal history of other diseases of the respiratory system: Secondary | ICD-10-CM

## 2017-05-03 DIAGNOSIS — I2510 Atherosclerotic heart disease of native coronary artery without angina pectoris: Secondary | ICD-10-CM | POA: Diagnosis present

## 2017-05-03 DIAGNOSIS — R7989 Other specified abnormal findings of blood chemistry: Secondary | ICD-10-CM

## 2017-05-03 DIAGNOSIS — Z79899 Other long term (current) drug therapy: Secondary | ICD-10-CM

## 2017-05-03 DIAGNOSIS — R778 Other specified abnormalities of plasma proteins: Secondary | ICD-10-CM

## 2017-05-03 HISTORY — PX: LEFT HEART CATH AND CORONARY ANGIOGRAPHY: CATH118249

## 2017-05-03 LAB — CBC
HCT: 42.4 % (ref 36.0–46.0)
HEMOGLOBIN: 13.9 g/dL (ref 12.0–15.0)
MCH: 29 pg (ref 26.0–34.0)
MCHC: 32.8 g/dL (ref 30.0–36.0)
MCV: 88.3 fL (ref 78.0–100.0)
Platelets: 411 10*3/uL — ABNORMAL HIGH (ref 150–400)
RBC: 4.8 MIL/uL (ref 3.87–5.11)
RDW: 12.8 % (ref 11.5–15.5)
WBC: 11.2 10*3/uL — ABNORMAL HIGH (ref 4.0–10.5)

## 2017-05-03 LAB — I-STAT TROPONIN, ED
TROPONIN I, POC: 0.01 ng/mL (ref 0.00–0.08)
Troponin i, poc: 14.16 ng/mL (ref 0.00–0.08)

## 2017-05-03 LAB — BASIC METABOLIC PANEL
ANION GAP: 13 (ref 5–15)
BUN: 10 mg/dL (ref 6–20)
CALCIUM: 9 mg/dL (ref 8.9–10.3)
CO2: 20 mmol/L — AB (ref 22–32)
CREATININE: 0.78 mg/dL (ref 0.44–1.00)
Chloride: 103 mmol/L (ref 101–111)
GFR calc Af Amer: >60 mL/min (ref >60)
GLUCOSE: 193 mg/dL — AB (ref 65–99)
Potassium: 3.4 mmol/L — ABNORMAL LOW (ref 3.5–5.1)
Sodium: 136 mmol/L (ref 135–145)

## 2017-05-03 LAB — I-STAT BETA HCG BLOOD, ED (MC, WL, AP ONLY): I-stat hCG, quantitative: <5 m[IU]/mL (ref <5)

## 2017-05-03 LAB — MRSA PCR SCREENING: MRSA by PCR: NEGATIVE

## 2017-05-03 SURGERY — LEFT HEART CATH AND CORONARY ANGIOGRAPHY
Anesthesia: LOCAL

## 2017-05-03 MED ORDER — NITROGLYCERIN 1 MG/10 ML FOR IR/CATH LAB
INTRA_ARTERIAL | Status: AC
  Filled 2017-05-03: qty 10

## 2017-05-03 MED ORDER — SODIUM CHLORIDE 0.9 % IV SOLN: 250.0000 mL | INTRAVENOUS | Status: DC | PRN

## 2017-05-03 MED ORDER — SODIUM CHLORIDE 0.9 % IV SOLN
INTRAVENOUS | Status: AC | PRN
  Administered 2017-05-03: 50 mL/h via INTRAVENOUS

## 2017-05-03 MED ORDER — LIDOCAINE HCL (PF) 1 % IJ SOLN
INTRAMUSCULAR | Status: AC
  Filled 2017-05-03: qty 30

## 2017-05-03 MED ORDER — ASPIRIN 81 MG PO CHEW
324.0000 mg | CHEWABLE_TABLET | Freq: Once | ORAL | Status: AC
  Administered 2017-05-03: 324 mg via ORAL
  Filled 2017-05-03: qty 4

## 2017-05-03 MED ORDER — SODIUM CHLORIDE 0.9% FLUSH: 3.0000 mL | INTRAVENOUS | Status: DC | PRN

## 2017-05-03 MED ORDER — VERAPAMIL HCL 2.5 MG/ML IV SOLN
INTRAVENOUS | Status: DC | PRN
  Administered 2017-05-03: 19:00:00 via INTRA_ARTERIAL

## 2017-05-03 MED ORDER — ONDANSETRON HCL 4 MG/2ML IJ SOLN: 4.0000 mg | Freq: Four times a day (QID) | INTRAMUSCULAR | Status: DC | PRN

## 2017-05-03 MED ORDER — SODIUM CHLORIDE 0.9% FLUSH
3.0000 mL | Freq: Two times a day (BID) | INTRAVENOUS | Status: DC
  Administered 2017-05-04 – 2017-05-05 (×3): 3 mL via INTRAVENOUS

## 2017-05-03 MED ORDER — LIDOCAINE HCL (PF) 1 % IJ SOLN
INTRAMUSCULAR | Status: DC | PRN
  Administered 2017-05-03: 2 mL

## 2017-05-03 MED ORDER — IOPAMIDOL (ISOVUE-370) INJECTION 76%
INTRAVENOUS | Status: AC
  Filled 2017-05-03: qty 100

## 2017-05-03 MED ORDER — ALBUTEROL SULFATE (2.5 MG/3ML) 0.083% IN NEBU: 3.0000 mL | INHALATION_SOLUTION | Freq: Four times a day (QID) | RESPIRATORY_TRACT | Status: DC | PRN

## 2017-05-03 MED ORDER — NITROGLYCERIN 1 MG/10 ML FOR IR/CATH LAB
INTRA_ARTERIAL | Status: DC | PRN
  Administered 2017-05-03: 200 ug via INTRACORONARY

## 2017-05-03 MED ORDER — NITROGLYCERIN 0.4 MG SL SUBL
SUBLINGUAL_TABLET | SUBLINGUAL | Status: AC
  Filled 2017-05-03: qty 1

## 2017-05-03 MED ORDER — HEPARIN (PORCINE) IN NACL 2-0.9 UNIT/ML-% IJ SOLN
INTRAMUSCULAR | Status: AC
  Filled 2017-05-03: qty 1000

## 2017-05-03 MED ORDER — HEPARIN (PORCINE) IN NACL 2-0.9 UNIT/ML-% IJ SOLN
INTRAMUSCULAR | Status: AC | PRN
  Administered 2017-05-03: 1500 mL via INTRA_ARTERIAL

## 2017-05-03 MED ORDER — FENTANYL CITRATE (PF) 100 MCG/2ML IJ SOLN
INTRAMUSCULAR | Status: AC
  Filled 2017-05-03: qty 2

## 2017-05-03 MED ORDER — PNEUMOCOCCAL VAC POLYVALENT 25 MCG/0.5ML IJ INJ: 0.5000 mL | INJECTION | INTRAMUSCULAR | Status: DC | PRN

## 2017-05-03 MED ORDER — VENLAFAXINE HCL ER 150 MG PO CP24
150.0000 mg | ORAL_CAPSULE | Freq: Every day | ORAL | Status: DC
  Administered 2017-05-04 – 2017-05-05 (×2): 150 mg via ORAL
  Filled 2017-05-03 (×2): qty 1

## 2017-05-03 MED ORDER — IOPAMIDOL (ISOVUE-370) INJECTION 76%
INTRAVENOUS | Status: DC | PRN
  Administered 2017-05-03: 60 mL via INTRA_ARTERIAL

## 2017-05-03 MED ORDER — FENTANYL CITRATE (PF) 100 MCG/2ML IJ SOLN
INTRAMUSCULAR | Status: DC | PRN
  Administered 2017-05-03 (×2): 25 ug via INTRAVENOUS

## 2017-05-03 MED ORDER — ACETAMINOPHEN 325 MG PO TABS
650.0000 mg | ORAL_TABLET | ORAL | Status: DC | PRN
  Filled 2017-05-03: qty 2

## 2017-05-03 MED ORDER — VENLAFAXINE HCL ER 75 MG PO CP24: 75.0000 mg | ORAL_CAPSULE | ORAL | Status: DC

## 2017-05-03 MED ORDER — VERAPAMIL HCL 2.5 MG/ML IV SOLN
INTRAVENOUS | Status: AC
  Filled 2017-05-03: qty 2

## 2017-05-03 MED ORDER — MIDAZOLAM HCL 2 MG/2ML IJ SOLN
INTRAMUSCULAR | Status: DC | PRN
  Administered 2017-05-03: 2 mg via INTRAVENOUS
  Administered 2017-05-03: 1 mg via INTRAVENOUS

## 2017-05-03 MED ORDER — MIDAZOLAM HCL 2 MG/2ML IJ SOLN
INTRAMUSCULAR | Status: AC
  Filled 2017-05-03: qty 2

## 2017-05-03 MED ORDER — SODIUM CHLORIDE 0.9 % IV SOLN: INTRAVENOUS | Status: AC

## 2017-05-03 MED ORDER — BUDESONIDE 0.25 MG/2ML IN SUSP
0.2500 mg | Freq: Two times a day (BID) | RESPIRATORY_TRACT | Status: DC
  Administered 2017-05-03 – 2017-05-05 (×4): 0.25 mg via RESPIRATORY_TRACT
  Filled 2017-05-03 (×5): qty 2

## 2017-05-03 MED ORDER — ASPIRIN 81 MG PO CHEW
81.0000 mg | CHEWABLE_TABLET | Freq: Every day | ORAL | Status: AC
  Administered 2017-05-04: 81 mg via ORAL
  Filled 2017-05-03: qty 1

## 2017-05-03 SURGICAL SUPPLY — 10 items
CATH 5FR JL3.5 JR4 ANG PIG MP (CATHETERS) ×2 IMPLANT
DEVICE RAD COMP TR BAND LRG (VASCULAR PRODUCTS) ×2 IMPLANT
ELECT DEFIB PAD ADLT CADENCE (PAD) ×2 IMPLANT
GLIDESHEATH SLEND SS 6F .021 (SHEATH) ×2 IMPLANT
GUIDEWIRE INQWIRE 1.5J.035X260 (WIRE) ×1 IMPLANT
INQWIRE 1.5J .035X260CM (WIRE) ×2
KIT HEART LEFT (KITS) ×2 IMPLANT
PACK CARDIAC CATHETERIZATION (CUSTOM PROCEDURE TRAY) ×2 IMPLANT
TRANSDUCER W/STOPCOCK (MISCELLANEOUS) ×2 IMPLANT
TUBING CIL FLEX 10 FLL-RA (TUBING) ×2 IMPLANT

## 2017-05-03 NOTE — ED Provider Notes (Signed)
36 year old female with si55gnificant family history of coronary disease here with episode of chest pain, diaphoresis, and nausea.  Patient was roomed in the ED prior to my assessment.  I reviewed her EKG from 1127 which is concerning for ST elevations in the lateral leads with reciprocal inferior depressions.  Unsure if this was showed to MD initially. I immediately had the tech repeat an EKG on room arrival, which shows resolution of these changes.  Initial EKG was concerning for possible STEMI, but EKG now shows no ongoing ischemia. Concern for transient ischemia.  Differential includes vasospasm versus dissection.  Will consult cardiology immediately for evaluation, and will activate STEMI if she develops any recurrent CP here. ASA given. Will hold on heparin given concern for possible dissection.   Kristi PollackIsaacs, Kristi Seguin, MD 05/03/17 [phone]

## 2017-05-03 NOTE — H&P (Signed)
Physician History and Physical     Patient ID: Kristi PlumKristan N Azevedo MRN: [id number] DOB/AGE: 35-Apr-1982 35 y.o. Admit date: 05/03/2017  Primary Care Physician: Default, Provider, MD Primary Cardiologist: [facility]  Active Problems:   Chest pain   HPI:  36 y.o. with history of HTN and asthma.No history of heart problems Around 11:00 am was helping friend clean house and developed SSCP radiating to left arm with diaphoresis . Pain 10/10. Did not improve Went to [employer] and persisted Came to ER around 11:00 To my view initial ECG with acute ST elevation I,AVL with reciprocal depression in 3,F.This ECG not acted on Eventually brought back to ER and repeat ECG around 5:00 with normalization. She is still having 2/10 pain. No dypnea or wheezing pain not positions. No fever pleurisy or URI. Has never had this pain before Mother with history of premature CAD. HCG negative in ER   Review of systems complete and found to be negative unless listed above   Past Medical History:  Diagnosis Date  . Asthma     Family History  Problem Relation Age of Onset  . Heart disease Mother     Social History   Socioeconomic History  . Marital status: Single    Spouse name: Not on file  . Number of children: Not on file  . Years of education: Not on file  . Highest education level: Not on file  Social Needs  . Financial resource strain: Not on file  . Food insecurity - worry: Not on file  . Food insecurity - inability: Not on file  . Transportation needs - medical: Not on file  . Transportation needs - non-medical: Not on file  Occupational History  . Not on file  Tobacco Use  . Smoking status: Former Smoker    Packs/day: 0.50  Substance and Sexual Activity  . Alcohol use: Yes  . Drug use: No  . Sexual activity: Yes  Other Topics Concern  . Not on file  Social History Narrative  . Not on file    History reviewed. No pertinent surgical history.    (Not in a hospital  admission)  Physical Exam: Blood pressure 134/80, pulse 83, temperature (!) 97.4 F (36.3 C), temperature source Axillary, resp. rate 20, height 5\' 7"  (1.702 m), weight 200 lb (90.7 kg), last menstrual period 05/03/2017, SpO2 98 %.    Affect appropriate Healthy:  appears stated age HEENT: normal Neck supple with no adenopathy JVP normal no bruits no thyromegaly Lungs clear with no wheezing and good diaphragmatic motion Heart:  S1/S2 no murmur, no rub, gallop or click PMI normal Abdomen: benighn, BS positve, no tenderness, no AAA no bruit.  No HSM or HJR Distal pulses intact with no bruits No edema Neuro non-focal Skin warm and dry No muscular weakness  No current facility-administered medications on file prior to encounter.    Current Outpatient Medications on File Prior to Encounter  Medication Sig Dispense Refill  . albuterol (PROVENTIL HFA;VENTOLIN HFA) 108 (90 BASE) MCG/ACT inhaler Inhale 2 puffs into the lungs every 6 (six) hours as needed for wheezing or shortness of breath.    . beclomethasone (QVAR) 80 MCG/ACT inhaler Inhale 2 puffs into the lungs 2 (two) times daily.    Marland Kitchen. venlafaxine XR (EFFEXOR-XR) 150 MG 24 hr capsule Take 75-150 mg by mouth See admin instructions. 150mg  in am and 75mg  at 1300      Labs:   Lab Results  Component Value Date   WBC 11.2 (H)  05/03/2017   HGB 13.9 05/03/2017   HCT 42.4 05/03/2017   MCV 88.3 05/03/2017   PLT 411 (H) 05/03/2017    Recent Labs  Lab 05/03/17 1130  NA 136  K 3.4*  CL 103  CO2 20*  BUN 10  CREATININE 0.78  CALCIUM 9.0  GLUCOSE 193*   No results found for: CKTOTAL, CKMB, CKMBINDEX, TROPONINI  No results found for: CHOL No results found for: HDL No results found for: LDLCALC No results found for: TRIG No results found for: CHOLHDL No results found for: LDLDIRECT     Radiology: Dg Chest 2 View  Result Date: 05/03/2017 CLINICAL DATA:  Pt c/o mid-sternal chest pains, left arm and jaw numbness and tingling  x today. Hx asthma. EXAM: CHEST  2 VIEW COMPARISON:  None. FINDINGS: Normal mediastinum and cardiac silhouette. Normal pulmonary vasculature. No evidence of effusion, infiltrate, or pneumothorax. No acute bony abnormality. IMPRESSION: No acute cardiopulmonary process. Electronically Signed   By: Genevive BiStewart  Edmunds M.D.   On: 05/03/2017 12:16    EKG: 1) SR ST elevation I,AVL reciprocal depression 3,F 2) acute changes resolved   ASSESSMENT AND PLAN:   Chest pain:  With acute ECG changes transient still with pain. Given age demographic suspect spasm or coronary artery dissection. Discussed with patient and favor urgent cath to identify mechanism of ECG changes. No heparin for now  HTN:  Continue beta blocker  Asthma: stable no active wheezing CXR with no active disease    Signed: Amana Bouska Nishan12/28/2018, 5:40 PM

## 2017-05-03 NOTE — ED Triage Notes (Addendum)
Pt states after cleaning a house she was driving to another house when her chest started having a sharp pain and feeling tingling in both's arms and hands.Pt reports she had pain in left side of jaw when the pain first started. Pt describing pain as sharp and stabbing. Father had MI at age 35.

## 2017-05-03 NOTE — ED Provider Notes (Signed)
[facility] Provider Note   CSN: [id number] Arrival date & time: 05/03/17  1120     History   Chief Complaint Chief Complaint  Patient presents with  . Chest Pain    HPI Kristi Chambers is a 36 y.o. female.  Kristi Chambers is a 36 y.o. Female who presents to the emergency department complaining of chest pain with onset this afternoon. Patient reports around 12 pm she was sitting in her car when she started having substernal chest pain that began to radiate to her left hand.  She reports she felt like her left hand and arm began to feel numb and tingly.  She reports that this progressed her right arm also began to feel numb and tingly and then both of her legs.  She reports the numbness and tingling spread into her jaw and into her tongue. She reports she was sweating.  She was very concerned about this chest pain and went to the emergency department.  While waiting in the waiting room her numbness and tingling resolved and her chest pain is now down to a 2 out of 10.  No treatments prior to arrival.  She does report family history of her father having heart attack at age 44.  She is a former smoker and quit 10-15 years ago.  She denies history of hypertension or hyperlipidemia.  No personal history of MI, DVT or PE.  No recent long travel.  No exogenous estrogen use.  She had no shortness of breath with her symptoms.  No identifiable worsening or alleviating factors.  Her pain was not worse with exertion.  She denies of fevers, coughing, shortness of breath, palpitations, leg pain, leg swelling, lightheadedness, dizziness, syncope, urinary symptoms or rashes.   The history is provided by the patient and medical records. No language interpreter was used.  Chest Pain   Associated symptoms include numbness (resolved. ). Pertinent negatives include no abdominal pain, no back pain, no cough, no fever, no headaches, no nausea, no palpitations, no shortness of breath, no  vomiting and no weakness.    Past Medical History:  Diagnosis Date  . Asthma     Patient Active Problem List   Diagnosis Date Noted  . Chest pain 05/03/2017  . Elevated troponin   . Coronary artery dissection     History reviewed. No pertinent surgical history.  OB History    No data available       Home Medications    Prior to Admission medications   Medication Sig Start Date End Date Taking? Authorizing Provider  albuterol (PROAIR HFA) 108 (90 Base) MCG/ACT inhaler Inhale 2 puffs into the lungs every 6 (six) hours as needed for wheezing or shortness of breath.   Yes [provider]  Aspirin-Acetaminophen-Caffeine (GOODY HEADACHE PO) Take 1 packet by mouth 2 (two) times daily as needed (pain/headache).   Yes [provider]  atenolol (TENORMIN) 25 MG tablet Take 25 mg by mouth at bedtime.   Yes [provider]  atorvastatin (LIPITOR) 40 MG tablet Take 40 mg by mouth at bedtime.   Yes [provider]  beclomethasone (QVAR) 80 MCG/ACT inhaler Inhale 2 puffs into the lungs 2 (two) times daily.   Yes [provider]  levonorgestrel (MIRENA) 20 MCG/24HR IUD 1 each by Intrauterine route once. Implanted October 2017   Yes [provider]  omeprazole (PRILOSEC) 40 MG capsule Take 40 mg by mouth at bedtime.   Yes [provider]  venlafaxine XR (EFFEXOR-XR) 150 MG 24 hr capsule Take 150 mg by mouth daily. 150mg  in am and 75mg  at 1300   Yes [provider]  venlafaxine XR (EFFEXOR-XR) 75 MG 24 hr capsule Take 75 mg by mouth daily after lunch.   Yes [provider]    Family History Family History  Problem Relation Age of Onset  . Heart disease Mother     Social History Social History   Tobacco Use  . Smoking status: Former Smoker    Packs/day: 0.50  Substance Use Topics  . Alcohol use: Yes  . Drug use: No     Allergies   Patient has no known allergies.   Review of Systems Review of  Systems  Constitutional: Negative for chills and fever.  HENT: Negative for congestion and sore throat.   Eyes: Negative for visual disturbance.  Respiratory: Negative for cough, shortness of breath and wheezing.   Cardiovascular: Positive for chest pain. Negative for palpitations and leg swelling.  Gastrointestinal: Negative for abdominal pain, diarrhea, nausea and vomiting.  Genitourinary: Negative for dysuria.  Musculoskeletal: Negative for back pain and neck pain.  Skin: Negative for rash.  Neurological: Positive for numbness (resolved. ). Negative for syncope, weakness and headaches.     Physical Exam Updated Vital Signs BP 133/76 (BP Location: Left Arm)   Pulse 75   Temp 98.2 F (36.8 C) (Oral)   Resp 19   Ht 5\' 7"  (1.702 m)   Wt 93.8 kg (206 lb 12.7 oz)   LMP 05/03/2017 (LMP Unknown)   SpO2 96%   BMI 32.39 kg/m   Physical Exam  Constitutional: She appears well-developed and well-nourished.  Non-toxic appearance. She does not appear ill. No distress.  HENT:  Head: Normocephalic and atraumatic.  Mouth/Throat: Oropharynx is clear and moist.  Eyes: Conjunctivae are normal. Pupils are equal, round, and reactive to light. Right eye exhibits no discharge. Left eye exhibits no discharge.  Neck: Neck supple. No JVD present.  Cardiovascular: Normal rate, regular rhythm, normal heart sounds and intact distal pulses. Exam reveals no gallop and no friction rub.  No murmur heard. Pulses:      Radial pulses are 2+ on the right side, and 2+ on the left side.       Dorsalis pedis pulses are 2+ on the right side, and 2+ on the left side.       Posterior tibial pulses are 2+ on the right side, and 2+ on the left side.  Pulmonary/Chest: Effort normal and breath sounds normal. No tachypnea. No respiratory distress. She has no wheezes. She has no rales.  Lungs are clear to ascultation bilaterally. Symmetric chest expansion bilaterally. No increased work of breathing. No rales or rhonchi.     Abdominal: Soft. There is no tenderness.  Musculoskeletal: She exhibits no edema.       Right lower leg: She exhibits no tenderness and no edema.       Left lower leg: She exhibits no tenderness and no edema.  Lymphadenopathy:    She has no cervical adenopathy.  Neurological: She is alert. Coordination normal.  Skin: Skin is warm and dry. Capillary refill takes less than 2 seconds. No rash noted. She is not diaphoretic. No erythema. No pallor.  Psychiatric: She has a normal mood and affect. Her behavior is normal.  Nursing note and vitals reviewed.    ED Treatments / Results  Labs (all labs ordered are listed, but only abnormal results are displayed) Labs Reviewed  BASIC METABOLIC PANEL - Abnormal; Notable for the following components:      Result Value   Potassium 3.4 (*)    CO2 20 (*)    Glucose, Bld 193 (*)    All other components within normal limits  CBC - Abnormal; Notable for the following components:   WBC 11.2 (*)    Platelets 411 (*)    All other components within normal limits  RAPID URINE DRUG SCREEN, HOSP PERFORMED - Abnormal; Notable for the following components:   Benzodiazepines POSITIVE (*)    Amphetamines POSITIVE (*)    All other components within normal limits  I-STAT TROPONIN, ED - Abnormal; Notable for the following components:   Troponin i, poc 14.16 (*)    All other components within normal limits  MRSA PCR SCREENING  I-STAT TROPONIN, ED  I-STAT BETA HCG BLOOD, ED ([facility], [facility], [facility] ONLY)    EKG  EKG Interpretation  Date/Time:  Friday May 03 2017 17:03:36 EST Ventricular Rate:  76 PR Interval:    QRS Duration: 89 QT Interval:  412 QTC Calculation: 464 R Axis:   53 Text Interpretation:  Sinus rhythm Anteroseptal infarct, age indeterminate Since last EKG, ST depressions in inferior leads have resolved No ST elevations  Confirmed by Shaune PollackIsaacs, Cameron [phone]([id number]) on 05/03/2017 5:08:36 PM       Radiology Dg Chest 2 View  Result Date:  05/03/2017 CLINICAL DATA:  Pt c/o mid-sternal chest pains, left arm and jaw numbness and tingling x today. Hx asthma. EXAM: CHEST  2 VIEW COMPARISON:  None. FINDINGS: Normal mediastinum and cardiac silhouette. Normal pulmonary vasculature. No evidence of effusion, infiltrate, or pneumothorax. No acute bony abnormality. IMPRESSION: No acute cardiopulmonary process. Electronically Signed   By: Genevive BiStewart  Edmunds M.D.   On: 05/03/2017 12:16    Procedures Procedures (including critical care time)  CRITICAL CARE Performed by: Lawana Chambersansie,Sarea Fyfe Duncan   Total critical care time: 35  minutes  Critical care time was exclusive of separately billable procedures and treating other patients.  Critical care was necessary to treat or prevent imminent or life-threatening deterioration.  Critical care was time spent personally by me on the following activities: development of treatment plan with patient and/or surrogate as well as nursing, discussions with consultants, evaluation of patient's response to treatment, examination of patient, obtaining history from patient or surrogate, ordering and performing treatments and interventions, ordering and review of laboratory studies, ordering and review of radiographic studies, pulse oximetry and re-evaluation of patient's condition.   Medications Ordered in ED Medications  albuterol (PROVENTIL) (2.5 MG/3ML) 0.083% nebulizer solution 3 mL (not administered)  budesonide (PULMICORT) nebulizer solution 0.25 mg (0.25 mg Nebulization Given 05/03/17 2053)  acetaminophen (TYLENOL) tablet 650 mg (not administered)  ondansetron (ZOFRAN) injection 4 mg (not administered)  0.9 %  sodium chloride infusion ( Intravenous Rate/Dose Verify 05/03/17 2300)  sodium chloride flush (NS) 0.9 % injection 3 mL (3 mLs Intravenous Not Given 05/03/17 2327)  sodium chloride flush (NS) 0.9 % injection 3 mL (not administered)  0.9 %  sodium chloride infusion (not administered)  aspirin  chewable tablet 81 mg (81 mg Oral Not Given 05/03/17 2038)  venlafaxine XR (EFFEXOR-XR) 24 hr capsule 150 mg (not administered)  pneumococcal 23 valent vaccine (PNU-IMMUNE) injection 0.5 mL (not administered)  aspirin chewable tablet 324 mg (324 mg Oral Given 05/03/17 1723)  heparin infusion 2 units/mL in 0.9 % sodium chloride (1,500 mLs Intra-arterial New Bag/Given 05/03/17 1834)  0.9 %  sodium chloride infusion (50 mL/hr Intravenous  New Bag/Given 05/03/17 1828)     Initial Impression / Assessment and Plan / ED Course  I have reviewed the triage vital signs and the nursing notes.  Pertinent labs & imaging results that were available during my care of the patient were reviewed by me and considered in my medical decision making (see chart for details).     This is a 36 y.o. Female who presents to the emergency department complaining of chest pain with onset this afternoon. Patient reports around 12 pm she was sitting in her car when she started having substernal chest pain that began to radiate to her left hand.  She reports she felt like her left hand and arm began to feel numb and tingly.  She reports that this progressed her right arm also began to feel numb and tingly and then both of her legs.  She reports the numbness and tingling spread into her jaw and into her tongue. She reports she was sweating.  She was very concerned about this chest pain and went to the emergency department.  While waiting in the waiting room her numbness and tingling resolved and her chest pain is now down to a 2 out of 10.  No treatments prior to arrival.  She does report family history of her father having heart attack at age 35.  She is a former smoker and quit 10-15 years ago. On my exam the patient is afebrile nontoxic-appearing.  Her lungs are clear to auscultation bilaterally.  She is not tachypneic, tachycardic or hypoxic on exam.  She is neurovascularly intact.  Her initial EKG is very concerning.  She has ST  depression in lead III as well as ST elevations in 1 and aVL. This EKG was obtained at 11:30 am prior to my shift.  Repeat EKG was ordered and showed resolution of these changes.  This is concerning. ASA and repeat troponin ordered.  Cardiology was consulted immediately.  I spoke with Dr. Eden EmmsNishan who evaluated the EKGs and reports he will be down to see the patient now.   Dr. Eden EmmsNishan evaluate the patient would like the patient to go to the Cath Lab which she will arrange and plans for admission.  Patient agreed with plan for admission. He suspects coronary artery dissection.   A short while later patient's repeat troponin resulted at 14.16. Patient being taken to the cath lab now. As we are suspicious for coronary artery dissection no heparin was ordered.  This patient was discussed with and evaluated by Dr. Erma HeritageIsaacs who agrees with assessment and plan.   Final Clinical Impressions(s) / ED Diagnoses   Final diagnoses:  Chest pain, rule out acute myocardial infarction  Elevated troponin    ED Discharge Orders    None       Everlene FarrierDansie, Bill Mcvey, PA-C 05/04/17 0125    Shaune PollackIsaacs, Cameron, MD 05/04/17 1147

## 2017-05-03 NOTE — Interval H&P Note (Signed)
Cath Lab Visit (complete for each Cath Lab visit)  Clinical Evaluation Leading to the Procedure:   ACS: Yes.    Non-ACS:    Anginal Classification: CCS IV  Anti-ischemic medical therapy: Minimal Therapy (1 class of medications)  Non-Invasive Test Results: No non-invasive testing performed  Prior CABG: No previous CABG   Dynamic ECG changes   History and Physical Interval Note:  05/03/2017 6:27 PM  Kristi Chambers  has presented today for surgery, with the diagnosis of cp  The various methods of treatment have been discussed with the patient and family. After consideration of risks, benefits and other options for treatment, the patient has consented to  Procedure(s): LEFT HEART CATH AND CORONARY ANGIOGRAPHY (N/A) as a surgical intervention .  The patient's history has been reviewed, patient examined, no change in status, stable for surgery.  I have reviewed the patient's chart and labs.  Questions were answered to the patient's satisfaction.     Lance MussJayadeep Laretta Pyatt

## 2017-05-03 NOTE — ED Notes (Signed)
Cardiology MD at bedside.

## 2017-05-04 ENCOUNTER — Encounter (HOSPITAL_COMMUNITY): Payer: Self-pay | Admitting: Anesthesiology

## 2017-05-04 ENCOUNTER — Other Ambulatory Visit: Payer: Self-pay

## 2017-05-04 DIAGNOSIS — I10 Essential (primary) hypertension: Secondary | ICD-10-CM

## 2017-05-04 LAB — RAPID URINE DRUG SCREEN, HOSP PERFORMED
Amphetamines: POSITIVE — AB
Barbiturates: NOT DETECTED
Benzodiazepines: POSITIVE — AB
Cocaine: NOT DETECTED
OPIATES: NOT DETECTED
Tetrahydrocannabinol: NOT DETECTED

## 2017-05-04 MED ORDER — CARVEDILOL 3.125 MG PO TABS
3.1250 mg | ORAL_TABLET | Freq: Two times a day (BID) | ORAL | Status: DC
  Administered 2017-05-04 – 2017-05-05 (×3): 3.125 mg via ORAL
  Filled 2017-05-04 (×3): qty 1

## 2017-05-04 MED ORDER — VENLAFAXINE HCL 75 MG PO TABS: 75.0000 mg | ORAL_TABLET | Freq: Two times a day (BID) | ORAL | Status: DC

## 2017-05-04 MED ORDER — SODIUM CHLORIDE 0.9 % IV SOLN: 250.0000 mL | INTRAVENOUS | Status: DC | PRN

## 2017-05-04 MED ORDER — ASPIRIN 300 MG RE SUPP: 300.0000 mg | RECTAL | Status: AC

## 2017-05-04 MED ORDER — ACETAMINOPHEN 325 MG PO TABS
650.0000 mg | ORAL_TABLET | ORAL | Status: DC | PRN
  Administered 2017-05-04: 650 mg via ORAL

## 2017-05-04 MED ORDER — ASPIRIN 81 MG PO CHEW: 324.0000 mg | CHEWABLE_TABLET | ORAL | Status: AC

## 2017-05-04 MED ORDER — ASPIRIN EC 81 MG PO TBEC
81.0000 mg | DELAYED_RELEASE_TABLET | Freq: Every day | ORAL | Status: DC
  Administered 2017-05-05: 81 mg via ORAL
  Filled 2017-05-04: qty 1

## 2017-05-04 MED ORDER — SODIUM CHLORIDE 0.9% FLUSH
3.0000 mL | Freq: Two times a day (BID) | INTRAVENOUS | Status: DC
  Administered 2017-05-04 (×2): 3 mL via INTRAVENOUS

## 2017-05-04 MED ORDER — ATORVASTATIN CALCIUM 40 MG PO TABS
40.0000 mg | ORAL_TABLET | Freq: Every day | ORAL | Status: DC
  Administered 2017-05-04: 40 mg via ORAL
  Filled 2017-05-04: qty 1

## 2017-05-04 MED ORDER — ONDANSETRON HCL 4 MG/2ML IJ SOLN: 4.0000 mg | Freq: Four times a day (QID) | INTRAMUSCULAR | Status: DC | PRN

## 2017-05-04 MED ORDER — ATENOLOL 25 MG PO TABS
25.0000 mg | ORAL_TABLET | Freq: Every day | ORAL | Status: DC
  Filled 2017-05-04: qty 1

## 2017-05-04 MED ORDER — VENLAFAXINE HCL ER 75 MG PO CP24
75.0000 mg | ORAL_CAPSULE | Freq: Every day | ORAL | Status: DC
  Administered 2017-05-04 – 2017-05-05 (×2): 75 mg via ORAL
  Filled 2017-05-04 (×2): qty 1

## 2017-05-04 MED ORDER — SODIUM CHLORIDE 0.9% FLUSH: 3.0000 mL | INTRAVENOUS | Status: DC | PRN

## 2017-05-04 MED ORDER — NITROGLYCERIN 0.4 MG SL SUBL: 0.4000 mg | SUBLINGUAL_TABLET | SUBLINGUAL | Status: DC | PRN

## 2017-05-04 NOTE — Progress Notes (Addendum)
DAILY PROGRESS NOTE   Patient Name: Kristi Chambers Date of Encounter: 05/04/2017  Chief Complaint   No further chest pain  Patient Profile   36 yo female with HTN, dyslipidemia, ADD and asthma- presented with SSCP radiating to the left arm with diaphoresis and found to have STEMI. Cath demonstrated SCAD of the mid to distal LAD with 40% stenosis and TIMI 3 flow was noted.  Subjective   No chest pain today. Noted to be positive for amphetamines . Says she was started on adderal by her PCP about 1 month ago. This may have played a role in her cardiac event. Troponin elevated to 14.  Objective   Vitals:   05/04/17 0400 05/04/17 0500 05/04/17 0817 05/04/17 0914  BP: 128/65 138/80    Pulse: 83 62    Resp: 17 16    Temp: 98 F (36.7 C)  98 F (36.7 C)   TempSrc: Oral  Oral   SpO2: 97% 97%  100%  Weight:      Height:        Intake/Output Summary (Last 24 hours) at 05/04/2017 0951 Last data filed at 05/04/2017 0818 Gross per 24 hour  Intake 1120 ml  Output 500 ml  Net 620 ml   Filed Weights   05/03/17 1126 05/03/17 2000  Weight: 200 lb (90.7 kg) 206 lb 12.7 oz (93.8 kg)    Physical Exam   General appearance: alert and no distress Neck: no carotid bruit, no JVD and thyroid not enlarged, symmetric, no tenderness/mass/nodules Lungs: clear to auscultation bilaterally Heart: regular rate and rhythm Abdomen: soft, non-tender; bowel sounds normal; no masses,  no organomegaly Extremities: extremities normal, atraumatic, no cyanosis or edema Pulses: 2+ and symmetric Skin: Skin color, texture, turgor normal. No rashes or lesions Neurologic: Grossly normal Psych: Pleasant  Inpatient Medications    Scheduled Meds: . aspirin  81 mg Oral Daily  . [START ON 05/05/2017] aspirin EC  81 mg Oral Daily  . atenolol  25 mg Oral Daily  . budesonide (PULMICORT) nebulizer solution  0.25 mg Nebulization BID  . sodium chloride flush  3 mL Intravenous Q12H  . sodium chloride  flush  3 mL Intravenous Q12H  . venlafaxine XR  150 mg Oral Q breakfast  . venlafaxine XR  75 mg Oral Q1200    Continuous Infusions: . sodium chloride    . sodium chloride      PRN Meds: sodium chloride, sodium chloride, acetaminophen, acetaminophen, albuterol, nitroGLYCERIN, ondansetron (ZOFRAN) IV, pneumococcal 23 valent vaccine, sodium chloride flush, sodium chloride flush   Labs   Results for orders placed or performed during the hospital encounter of 05/03/17 (from the past 48 hour(s))  Basic metabolic panel     Status: Abnormal   Collection Time: 05/03/17 11:30 AM  Result Value Ref Range   Sodium 136 135 - 145 mmol/L   Potassium 3.4 (L) 3.5 - 5.1 mmol/L   Chloride 103 101 - 111 mmol/L   CO2 20 (L) 22 - 32 mmol/L   Glucose, Bld 193 (H) 65 - 99 mg/dL   BUN 10 6 - 20 mg/dL   Creatinine, Ser 0.78 0.44 - 1.00 mg/dL   Calcium 9.0 8.9 - 10.3 mg/dL   GFR calc non Af Amer >60 >60 mL/min   GFR calc Af Amer >60 >60 mL/min    Comment: (NOTE) The eGFR has been calculated using the CKD EPI equation. This calculation has not been validated in all clinical situations. eGFR's persistently <60 mL/min  signify possible Chronic Kidney Disease.    Anion gap 13 5 - 15  CBC     Status: Abnormal   Collection Time: 05/03/17 11:30 AM  Result Value Ref Range   WBC 11.2 (H) 4.0 - 10.5 K/uL   RBC 4.80 3.87 - 5.11 MIL/uL   Hemoglobin 13.9 12.0 - 15.0 g/dL   HCT 42.4 36.0 - 46.0 %   MCV 88.3 78.0 - 100.0 fL   MCH 29.0 26.0 - 34.0 pg   MCHC 32.8 30.0 - 36.0 g/dL   RDW 12.8 11.5 - 15.5 %   Platelets 411 (H) 150 - 400 K/uL  I-stat troponin, ED     Status: None   Collection Time: 05/03/17 11:39 AM  Result Value Ref Range   Troponin i, poc 0.01 0.00 - 0.08 ng/mL   Comment 3            Comment: Due to the release kinetics of cTnI, a negative result within the first hours of the onset of symptoms does not rule out myocardial infarction with certainty. If myocardial infarction is still  suspected, repeat the test at appropriate intervals.   I-Stat beta hCG blood, ED     Status: None   Collection Time: 05/03/17 11:39 AM  Result Value Ref Range   I-stat hCG, quantitative <5.0 <5 mIU/mL   Comment 3            Comment:   GEST. AGE      CONC.  (mIU/mL)   <=1 WEEK        5 - 50     2 WEEKS       50 - 500     3 WEEKS       100 - 10,000     4 WEEKS     1,000 - 30,000        FEMALE AND NON-PREGNANT FEMALE:     LESS THAN 5 mIU/mL   I-stat troponin, ED     Status: Abnormal   Collection Time: 05/03/17  5:44 PM  Result Value Ref Range   Troponin i, poc 14.16 (HH) 0.00 - 0.08 ng/mL   Comment NOTIFIED PHYSICIAN    Comment 3            Comment: Due to the release kinetics of cTnI, a negative result within the first hours of the onset of symptoms does not rule out myocardial infarction with certainty. If myocardial infarction is still suspected, repeat the test at appropriate intervals.   MRSA PCR Screening     Status: None   Collection Time: 05/03/17  7:58 PM  Result Value Ref Range   MRSA by PCR NEGATIVE NEGATIVE    Comment:        The GeneXpert MRSA Assay (FDA approved for NASAL specimens only), is one component of a comprehensive MRSA colonization surveillance program. It is not intended to diagnose MRSA infection nor to guide or monitor treatment for MRSA infections.   Rapid urine drug screen (hospital performed)     Status: Abnormal   Collection Time: 05/03/17 11:43 PM  Result Value Ref Range   Opiates NONE DETECTED NONE DETECTED   Cocaine NONE DETECTED NONE DETECTED   Benzodiazepines POSITIVE (A) NONE DETECTED   Amphetamines POSITIVE (A) NONE DETECTED   Tetrahydrocannabinol NONE DETECTED NONE DETECTED   Barbiturates NONE DETECTED NONE DETECTED    Comment: (NOTE) DRUG SCREEN FOR MEDICAL PURPOSES ONLY.  IF CONFIRMATION IS NEEDED FOR ANY PURPOSE, NOTIFY LAB WITHIN 5 DAYS.  LOWEST DETECTABLE LIMITS FOR URINE DRUG SCREEN Drug Class                      Cutoff (ng/mL) Amphetamine and metabolites    1000 Barbiturate and metabolites    200 Benzodiazepine                 161 Tricyclics and metabolites     300 Opiates and metabolites        300 Cocaine and metabolites        300 THC                            50     ECG   NSR at 82 with marked anterolateral TWI's - Personally Reviewed  Telemetry   NSR - Personally Reviewed  Radiology    Dg Chest 2 View  Result Date: 05/03/2017 CLINICAL DATA:  Pt c/o mid-sternal chest pains, left arm and jaw numbness and tingling x today. Hx asthma. EXAM: CHEST  2 VIEW COMPARISON:  None. FINDINGS: Normal mediastinum and cardiac silhouette. Normal pulmonary vasculature. No evidence of effusion, infiltrate, or pneumothorax. No acute bony abnormality. IMPRESSION: No acute cardiopulmonary process. Electronically Signed   By: Suzy Bouchard M.D.   On: 05/03/2017 12:16    Cardiac Studies   N/A  Assessment   1. Active Problems: 2.   Chest pain 3.   Elevated troponin 4.   Coronary artery dissection 5.   Plan   1. Plan for echo today to evaluate for cardiomyopathy - specifically for apical WMA. Adderal may have played a role in her event - this was started 1 month ago - says it has helped her concentration. Would prefer to keep her off of it for now. Switch atenolol to carvedilol 3.125 mg BID. Check FLP in am tomorrow. Ok to ambulate with cardiac rehab. Expect d/c home tomorrow.  Time Spent Directly with Patient:  I have spent a total of 25 minutes with the patient reviewing hospital notes, telemetry, EKGs, labs and examining the patient as well as establishing an assessment and plan that was discussed personally with the patient. > 50% of time was spent in direct patient care.  Length of Stay:  LOS: 1 day   Pixie Casino, MD, [facility], [place] Director of the [facility] Attending Cardiologist  Direct  Dial: [phone]  Fax: [phone]  Website:  [email or web address] Niomie Englert 05/04/2017, 9:51 AM

## 2017-05-05 ENCOUNTER — Inpatient Hospital Stay (HOSPITAL_COMMUNITY): Payer: Medicaid Other

## 2017-05-05 DIAGNOSIS — F988 Other specified behavioral and emotional disorders with onset usually occurring in childhood and adolescence: Secondary | ICD-10-CM | POA: Diagnosis present

## 2017-05-05 DIAGNOSIS — E785 Hyperlipidemia, unspecified: Secondary | ICD-10-CM

## 2017-05-05 DIAGNOSIS — R079 Chest pain, unspecified: Secondary | ICD-10-CM

## 2017-05-05 DIAGNOSIS — Z8709 Personal history of other diseases of the respiratory system: Secondary | ICD-10-CM

## 2017-05-05 LAB — BASIC METABOLIC PANEL
ANION GAP: 9 (ref 5–15)
BUN: 10 mg/dL (ref 6–20)
CO2: 25 mmol/L (ref 22–32)
Calcium: 8.6 mg/dL — ABNORMAL LOW (ref 8.9–10.3)
Chloride: 100 mmol/L — ABNORMAL LOW (ref 101–111)
Creatinine, Ser: 0.68 mg/dL (ref 0.44–1.00)
GFR calc Af Amer: >60 mL/min (ref >60)
Glucose, Bld: 103 mg/dL — ABNORMAL HIGH (ref 65–99)
POTASSIUM: 3.5 mmol/L (ref 3.5–5.1)
SODIUM: 134 mmol/L — AB (ref 135–145)

## 2017-05-05 LAB — LIPID PANEL
CHOL/HDL RATIO: 4.6 ratio
CHOLESTEROL: 146 mg/dL (ref 0–200)
HDL: 32 mg/dL — AB (ref >40)
LDL Cholesterol: 97 mg/dL (ref 0–99)
TRIGLYCERIDES: 85 mg/dL (ref <150)
VLDL: 17 mg/dL (ref 0–40)

## 2017-05-05 LAB — ECHOCARDIOGRAM COMPLETE
HEIGHTINCHES: 67 in
WEIGHTICAEL: 3308.66 [oz_av]

## 2017-05-05 LAB — HIV ANTIBODY (ROUTINE TESTING W REFLEX): HIV SCREEN 4TH GENERATION: NONREACTIVE

## 2017-05-05 MED ORDER — ATORVASTATIN CALCIUM 80 MG PO TABS: 80.0000 mg | ORAL_TABLET | Freq: Every day | ORAL | 3 refills | Status: DC

## 2017-05-05 MED ORDER — NITROGLYCERIN 0.4 MG SL SUBL: 0.4000 mg | SUBLINGUAL_TABLET | SUBLINGUAL | 2 refills | Status: DC | PRN

## 2017-05-05 MED ORDER — ASPIRIN 81 MG PO TBEC: 81.0000 mg | DELAYED_RELEASE_TABLET | Freq: Every day | ORAL | Status: AC

## 2017-05-05 MED ORDER — ACETAMINOPHEN 325 MG PO TABS: 650.0000 mg | ORAL_TABLET | ORAL | Status: DC | PRN

## 2017-05-05 MED ORDER — ATORVASTATIN CALCIUM 80 MG PO TABS: 80.0000 mg | ORAL_TABLET | Freq: Every day | ORAL | Status: DC

## 2017-05-05 MED ORDER — CARVEDILOL 3.125 MG PO TABS: 3.1250 mg | ORAL_TABLET | Freq: Two times a day (BID) | ORAL | 3 refills | Status: DC

## 2017-05-05 NOTE — Progress Notes (Signed)
Patient ambulated 370 feet in the hallway without any ambulatory devices. Patient denied chest pain and shortness of breath, but did state her chest on the left felt "a little tight." Assisted to the chair post walk.

## 2017-05-05 NOTE — Discharge Summary (Signed)
Discharge Summary    Patient ID: Kristi PlumKristan N Oliff,  MRN: [id number], DOB/AGE: [birth year] 36 y.o.  Admit date: 05/03/2017 Discharge date: 05/05/2017  Primary Care Provider: Default, Provider Primary Cardiologist: Quitman LivingsdR nISHAN  Discharge Diagnoses    Principal Problem:   STEMI (ST elevation myocardial infarction) [facility](HCC) Active Problems:   Coronary artery dissection   Dyslipidemia   ADD (attention deficit disorder)   History of asthma   Allergies No Known Allergies  Diagnostic Studies/Procedures    urgent Cath 05/03/17 _____________   History of Present Illness     36 y/o female admitted with STEMI- SCAD of mLAD at cath.   Hospital Course      36 y/o female with a history of HLD, asthma, and ADD admitted through the ED 05/03/17 with chest pain.   Around 11:00 am 12/28 she was helping a friend clean house and developed SSCP radiating to left arm with diaphoresis. Pain was described as severe-10/10. It did not improve completely. She went to [employer] and her chest pain persisted and she came to ER around 11:00am. Her initial ECG showed acute ST elevation I,AVL with reciprocal depression in 3,F.This ECG not acted on. Her intial troponin was negative.  Eventually a repeat ECG around 17:00 showed normalization. Dr Eden EmmsNishan was then called immediately after the EKGs were reviewed by the ED MD. Her second Troponin at 17:44 was elevated -14.16. She was taken urgently to the cath lab where cath revealed spontaneous MLAD dissection with TIMI 3 flow and a residual 40% stenosis. LVF was 50-55% at cath. Plan is for ASA, statin, and B/P control.   It was noted that pt had recently been placed on Adderall for her ADD. Its felt this may have contributed and it was recommended that this be discontinued. Dr Rennis GoldenHilty saw her the morning of 12/30 and felt she could be discharged. Her echo was done before discharge and the results are pending. She'll need a TOC OV in 7-10 days.     _____________  Discharge Vitals Blood pressure 129/76, pulse 90, temperature 98.5 F (36.9 C), temperature source Oral, resp. rate 15, height 5\' 7"  (1.702 m), weight 206 lb 12.7 oz (93.8 kg), last menstrual period 05/03/2017, SpO2 93 %.  Filed Weights   05/03/17 1126 05/03/17 2000  Weight: 200 lb (90.7 kg) 206 lb 12.7 oz (93.8 kg)    Labs & Radiologic Studies    CBC Recent Labs    05/03/17 1130  WBC 11.2*  HGB 13.9  HCT 42.4  MCV 88.3  PLT 411*   Basic Metabolic Panel Recent Labs    40/98/1112/28/18 1130 05/05/17 0304  NA 136 134*  K 3.4* 3.5  CL 103 100*  CO2 20* 25  GLUCOSE 193* 103*  BUN 10 10  CREATININE 0.78 0.68  CALCIUM 9.0 8.6*   Liver Function Tests No results for input(s): AST, ALT, ALKPHOS, BILITOT, PROT, ALBUMIN in the last 72 hours. No results for input(s): LIPASE, AMYLASE in the last 72 hours. Cardiac Enzymes No results for input(s): CKTOTAL, CKMB, CKMBINDEX, TROPONINI in the last 72 hours. BNP Invalid input(s): POCBNP D-Dimer No results for input(s): DDIMER in the last 72 hours. Hemoglobin A1C No results for input(s): HGBA1C in the last 72 hours. Fasting Lipid Panel Recent Labs    05/05/17 0304  CHOL 146  HDL 32*  LDLCALC 97  TRIG 85  CHOLHDL 4.6   Thyroid Function Tests No results for input(s): TSH, T4TOTAL, T3FREE, THYROIDAB in the last 72 hours.  Invalid input(s): FREET3 _____________  Dg Chest 2 View  Result Date: 05/03/2017 CLINICAL DATA:  Pt c/o mid-sternal chest pains, left arm and jaw numbness and tingling x today. Hx asthma. EXAM: CHEST  2 VIEW COMPARISON:  None. FINDINGS: Normal mediastinum and cardiac silhouette. Normal pulmonary vasculature. No evidence of effusion, infiltrate, or pneumothorax. No acute bony abnormality. IMPRESSION: No acute cardiopulmonary process. Electronically Signed   By: Genevive BiStewart  Edmunds M.D.   On: 05/03/2017 12:16   Disposition   Pt is being discharged home today in good condition.  Follow-up Plans  & Appointments    Follow-up Information    Wendall StadeNishan, Peter C, MD Follow up.   Specialty:  Cardiology Why:  office will conatct you Contact information: [address] [place] [address] [phone]            Discharge Medications   Allergies as of 05/05/2017   No Known Allergies     Medication List    STOP taking these medications   atenolol 25 MG tablet Commonly known as:  TENORMIN   GOODY HEADACHE PO     TAKE these medications   acetaminophen 325 MG tablet Commonly known as:  TYLENOL Take 2 tablets (650 mg total) by mouth every 4 (four) hours as needed for headache or mild pain.   aspirin 81 MG EC tablet Take 1 tablet (81 mg total) by mouth daily. Start taking on:  05/06/2017   atorvastatin 80 MG tablet Commonly known as:  LIPITOR Take 1 tablet (80 mg total) by mouth at bedtime. What changed:    medication strength  how much to take   beclomethasone 80 MCG/ACT inhaler Commonly known as:  QVAR Inhale 2 puffs into the lungs 2 (two) times daily.   carvedilol 3.125 MG tablet Commonly known as:  COREG Take 1 tablet (3.125 mg total) by mouth 2 (two) times daily with a meal.   levonorgestrel 20 MCG/24HR IUD Commonly known as:  MIRENA 1 each by Intrauterine route once. Implanted October 2017   nitroGLYCERIN 0.4 MG SL tablet Commonly known as:  NITROSTAT Place 1 tablet (0.4 mg total) under the tongue every 5 (five) minutes x 3 doses as needed for chest pain.   omeprazole 40 MG capsule Commonly known as:  PRILOSEC Take 40 mg by mouth at bedtime.   PROAIR HFA 108 (90 Base) MCG/ACT inhaler Generic drug:  albuterol Inhale 2 puffs into the lungs every 6 (six) hours as needed for wheezing or shortness of breath.   venlafaxine XR 75 MG 24 hr capsule Commonly known as:  EFFEXOR-XR Take 75 mg by mouth daily after lunch.   venlafaxine XR 150 MG 24 hr capsule Commonly known as:  EFFEXOR-XR Take 150 mg by mouth daily. 150mg  in am and  75mg  at 1300        Aspirin prescribed at discharge?  Yes High Intensity Statin Prescribed? (Lipitor 40-80mg  or Crestor 20-40mg ): Yes Beta Blocker Prescribed? Yes For EF <40%, was ACEI/ARB Prescribed? No: NA ADP Receptor Inhibitor Prescribed? (i.e. Plavix etc.-Includes Medically Managed Patients): No: no PCI/stent For EF <40%, Aldosterone Inhibitor Prescribed? No: NA Was EF assessed during THIS hospitalization? Yes Was Cardiac Rehab II ordered? (Included Medically managed Patients): Yes   Outstanding Labs/Studies   Echo done 05/05/17  Duration of Discharge Encounter   Greater than 30 minutes including physician time.  Jolene ProvostSigned, Ronell Boldin PA 05/05/2017, 11:22 AM

## 2017-05-05 NOTE — Discharge Instructions (Signed)
Heart Attack A heart attack (myocardial infarction, MI) causes damage to the heart that cannot be fixed. A heart attack often happens when a blood clot or other blockage cuts blood flow to the heart. When this happens, certain areas of the heart begin to die. This causes the pain you feel during a heart attack. Follow these instructions at home:  Take medicine as told by your doctor. You may need medicine to: ? Keep your blood from clotting too easily. ? Control your blood pressure. ? Lower your cholesterol. ? Control abnormal heart rhythms.  Change certain behaviors as told by your doctor. This may include: ? Quitting smoking. ? Being active. ? Eating a heart-healthy diet. Ask your doctor for help with this diet. ? Keeping a healthy weight. ? Keeping your diabetes under control. ? Lessening stress. ? Limiting how much alcohol you drink. Do not take these medicines unless your doctor says that you can:  Nonsteroidal anti-inflammatory drugs (NSAIDs). These include: ? Ibuprofen. ? Naproxen. ? Celecoxib.  Vitamin supplements that have vitamin A, vitamin E, or both.  Hormone therapy that contains estrogen with or without progestin.  Get help right away if:  You have sudden chest discomfort.  You have sudden discomfort in your: ? Arms. ? Back. ? Neck. ? Jaw.  You have shortness of breath at any time.  You have sudden sweating or clammy skin.  You feel sick to your stomach (nauseous) or throw up (vomit).  You suddenly get light-headed or dizzy.  You feel your heart beating fast or skipping beats. These symptoms may be an emergency. Do not wait to see if the symptoms will go away. Get medical help right away. Call your local emergency services (911 in the [place] Do not drive yourself to the hospital. This information is not intended to replace advice given to you by your health care provider. Make sure you discuss any questions you have with your health care  provider. Document Released: 10/23/2011 Document Revised: 09/29/2015 Document Reviewed: 06/26/2013 Elsevier Interactive Patient Education  2017 [employer] Inc[employer]  

## 2017-05-05 NOTE — Progress Notes (Signed)
Patient being discharged home. PIV removed. Ambulating independently with no assistance.  Discharge paperwork reviewed. All medications reviewed including what each medicine is for, how much to take and when to take it. Patient verbalizes understanding. Follow up information given to patient, importance of seeing cardiologist stressed and patient verbalizes understanding and how to reach the cardiologist should she need them before the appointment. Prescriptions reviewed as well as which pharmacy has her medicine. All questions answered. No acute distress noted. Vitals stable. Pt discharged to home via wheelchair.

## 2017-05-05 NOTE — Progress Notes (Signed)
DAILY PROGRESS NOTE   Patient Name: Kristi Chambers Date of Encounter: 05/05/2017  Chief Complaint   Feels fine  Patient Profile   36 yo female with HTN, dyslipidemia, ADD and asthma- presented with SSCP radiating to the left arm with diaphoresis and found to have STEMI. Cath demonstrated SCAD of the mid to distal LAD with 40% stenosis and TIMI 3 flow was noted.  Subjective   No chest pain today. Wants to go home. Echo has not been performed.  Objective   Vitals:   05/05/17 0800 05/05/17 0815 05/05/17 0849 05/05/17 0900  BP: 125/79  125/79 131/61  Pulse: 71  88 87  Resp:   15   Temp:  98.5 F (36.9 C)    TempSrc:  Oral    SpO2: 96%  95% 94%  Weight:      Height:        Intake/Output Summary (Last 24 hours) at 05/05/2017 0945 Last data filed at 05/05/2017 0800 Gross per 24 hour  Intake 840 ml  Output 1150 ml  Net -310 ml   Filed Weights   05/03/17 1126 05/03/17 2000  Weight: 200 lb (90.7 kg) 206 lb 12.7 oz (93.8 kg)    Physical Exam   General appearance: alert and no distress Neck: no carotid bruit, no JVD and thyroid not enlarged, symmetric, no tenderness/mass/nodules Lungs: clear to auscultation bilaterally Heart: regular rate and rhythm Abdomen: soft, non-tender; bowel sounds normal; no masses,  no organomegaly Extremities: extremities normal, atraumatic, no cyanosis or edema Pulses: 2+ and symmetric Skin: Skin color, texture, turgor normal. No rashes or lesions Neurologic: Grossly normal Psych: Pleasant  Inpatient Medications    Scheduled Meds: . aspirin EC  81 mg Oral Daily  . atorvastatin  40 mg Oral QHS  . budesonide (PULMICORT) nebulizer solution  0.25 mg Nebulization BID  . carvedilol  3.125 mg Oral BID WC  . sodium chloride flush  3 mL Intravenous Q12H  . sodium chloride flush  3 mL Intravenous Q12H  . venlafaxine XR  150 mg Oral Q breakfast  . venlafaxine XR  75 mg Oral Q1200    Continuous Infusions: . sodium chloride    . sodium  chloride      PRN Meds: sodium chloride, sodium chloride, acetaminophen, acetaminophen, albuterol, nitroGLYCERIN, ondansetron (ZOFRAN) IV, pneumococcal 23 valent vaccine, sodium chloride flush, sodium chloride flush   Labs   Results for orders placed or performed during the hospital encounter of 05/03/17 (from the past 48 hour(s))  Basic metabolic panel     Status: Abnormal   Collection Time: 05/03/17 11:30 AM  Result Value Ref Range   Sodium 136 135 - 145 mmol/L   Potassium 3.4 (L) 3.5 - 5.1 mmol/L   Chloride 103 101 - 111 mmol/L   CO2 20 (L) 22 - 32 mmol/L   Glucose, Bld 193 (H) 65 - 99 mg/dL   BUN 10 6 - 20 mg/dL   Creatinine, Ser 0.78 0.44 - 1.00 mg/dL   Calcium 9.0 8.9 - 10.3 mg/dL   GFR calc non Af Amer >60 >60 mL/min   GFR calc Af Amer >60 >60 mL/min    Comment: (NOTE) The eGFR has been calculated using the CKD EPI equation. This calculation has not been validated in all clinical situations. eGFR's persistently <60 mL/min signify possible Chronic Kidney Disease.    Anion gap 13 5 - 15  CBC     Status: Abnormal   Collection Time: 05/03/17 11:30 AM  Result Value  Ref Range   WBC 11.2 (H) 4.0 - 10.5 K/uL   RBC 4.80 3.87 - 5.11 MIL/uL   Hemoglobin 13.9 12.0 - 15.0 g/dL   HCT 42.4 36.0 - 46.0 %   MCV 88.3 78.0 - 100.0 fL   MCH 29.0 26.0 - 34.0 pg   MCHC 32.8 30.0 - 36.0 g/dL   RDW 12.8 11.5 - 15.5 %   Platelets 411 (H) 150 - 400 K/uL  I-stat troponin, ED     Status: None   Collection Time: 05/03/17 11:39 AM  Result Value Ref Range   Troponin i, poc 0.01 0.00 - 0.08 ng/mL   Comment 3            Comment: Due to the release kinetics of cTnI, a negative result within the first hours of the onset of symptoms does not rule out myocardial infarction with certainty. If myocardial infarction is still suspected, repeat the test at appropriate intervals.   I-Stat beta hCG blood, ED     Status: None   Collection Time: 05/03/17 11:39 AM  Result Value Ref Range   I-stat  hCG, quantitative <5.0 <5 mIU/mL   Comment 3            Comment:   GEST. AGE      CONC.  (mIU/mL)   <=1 WEEK        5 - 50     2 WEEKS       50 - 500     3 WEEKS       100 - 10,000     4 WEEKS     1,000 - 30,000        FEMALE AND NON-PREGNANT FEMALE:     LESS THAN 5 mIU/mL   I-stat troponin, ED     Status: Abnormal   Collection Time: 05/03/17  5:44 PM  Result Value Ref Range   Troponin i, poc 14.16 (HH) 0.00 - 0.08 ng/mL   Comment NOTIFIED PHYSICIAN    Comment 3            Comment: Due to the release kinetics of cTnI, a negative result within the first hours of the onset of symptoms does not rule out myocardial infarction with certainty. If myocardial infarction is still suspected, repeat the test at appropriate intervals.   MRSA PCR Screening     Status: None   Collection Time: 05/03/17  7:58 PM  Result Value Ref Range   MRSA by PCR NEGATIVE NEGATIVE    Comment:        The GeneXpert MRSA Assay (FDA approved for NASAL specimens only), is one component of a comprehensive MRSA colonization surveillance program. It is not intended to diagnose MRSA infection nor to guide or monitor treatment for MRSA infections.   Rapid urine drug screen (hospital performed)     Status: Abnormal   Collection Time: 05/03/17 11:43 PM  Result Value Ref Range   Opiates NONE DETECTED NONE DETECTED   Cocaine NONE DETECTED NONE DETECTED   Benzodiazepines POSITIVE (A) NONE DETECTED   Amphetamines POSITIVE (A) NONE DETECTED   Tetrahydrocannabinol NONE DETECTED NONE DETECTED   Barbiturates NONE DETECTED NONE DETECTED    Comment: (NOTE) DRUG SCREEN FOR MEDICAL PURPOSES ONLY.  IF CONFIRMATION IS NEEDED FOR ANY PURPOSE, NOTIFY LAB WITHIN 5 DAYS. LOWEST DETECTABLE LIMITS FOR URINE DRUG SCREEN Drug Class                     Cutoff (ng/mL) Amphetamine  and metabolites    1000 Barbiturate and metabolites    200 Benzodiazepine                 226 Tricyclics and metabolites     300 Opiates and  metabolites        300 Cocaine and metabolites        300 THC                            50   HIV antibody (Routine Testing)     Status: None   Collection Time: 05/04/17 10:51 AM  Result Value Ref Range   HIV Screen 4th Generation wRfx Non Reactive Non Reactive    Comment: (NOTE) Performed At: [facility] [place], [place] [id number] Rush Farmer MD WL:[id number]   Basic metabolic panel     Status: Abnormal   Collection Time: 05/05/17  3:04 AM  Result Value Ref Range   Sodium 134 (L) 135 - 145 mmol/L   Potassium 3.5 3.5 - 5.1 mmol/L   Chloride 100 (L) 101 - 111 mmol/L   CO2 25 22 - 32 mmol/L   Glucose, Bld 103 (H) 65 - 99 mg/dL   BUN 10 6 - 20 mg/dL   Creatinine, Ser 0.68 0.44 - 1.00 mg/dL   Calcium 8.6 (L) 8.9 - 10.3 mg/dL   GFR calc non Af Amer >60 >60 mL/min   GFR calc Af Amer >60 >60 mL/min    Comment: (NOTE) The eGFR has been calculated using the CKD EPI equation. This calculation has not been validated in all clinical situations. eGFR's persistently <60 mL/min signify possible Chronic Kidney Disease.    Anion gap 9 5 - 15  Lipid panel     Status: Abnormal   Collection Time: 05/05/17  3:04 AM  Result Value Ref Range   Cholesterol 146 0 - 200 mg/dL   Triglycerides 85 <150 mg/dL   HDL 32 (L) >40 mg/dL   Total CHOL/HDL Ratio 4.6 RATIO   VLDL 17 0 - 40 mg/dL   LDL Cholesterol 97 0 - 99 mg/dL    Comment:        Total Cholesterol/HDL:CHD Risk Coronary Heart Disease Risk Table                     Men   Women  1/2 Average Risk   3.4   3.3  Average Risk       5.0   4.4  2 X Average Risk   9.6   7.1  3 X Average Risk  23.4   11.0        Use the calculated Patient Ratio above and the CHD Risk Table to determine the patient's CHD Risk.        ATP III CLASSIFICATION (LDL):  <100     mg/dL   Optimal  100-129  mg/dL   Near or Above                    Optimal  130-159  mg/dL   Borderline  160-189  mg/dL   High  >190     mg/dL   Very High      ECG   N/A  Telemetry   NSR - Personally Reviewed  Radiology    Dg Chest 2 View  Result Date: 05/03/2017 CLINICAL DATA:  Pt c/o mid-sternal chest pains, left arm and jaw numbness and tingling  x today. Hx asthma. EXAM: CHEST  2 VIEW COMPARISON:  None. FINDINGS: Normal mediastinum and cardiac silhouette. Normal pulmonary vasculature. No evidence of effusion, infiltrate, or pneumothorax. No acute bony abnormality. IMPRESSION: No acute cardiopulmonary process. Electronically Signed   By: Suzy Bouchard M.D.   On: 05/03/2017 12:16    Cardiac Studies   N/A  Assessment   Active Problems:   Chest pain   Elevated troponin   Coronary artery dissection   Plan   1. Chest pain free - wants to go home. Echo pending- should not hold up discharge, if it cannot be performed this am, then as an outpatient. [place] for d/c home today. Follow-up with APP or Dr. Johnsie Cancel after discharge.  Time Spent Directly with Patient:  I have spent a total of 25 minutes with the patient reviewing hospital notes, telemetry, EKGs, labs and examining the patient as well as establishing an assessment and plan that was discussed personally with the patient. > 50% of time was spent in direct patient care.  Length of Stay:  LOS: 2 days   Pixie Casino, MD, [facility], [place] Director of the Advanced Lipid Disorders &  Cardiovascular [facility] Attending Cardiologist  Direct Dial: [phone]  Fax: [phone]  Website:  [email or web address] Jannifer Fischler 05/05/2017, 9:45 AM

## 2017-05-05 NOTE — Progress Notes (Signed)
  Echocardiogram 2D Echocardiogram has been performed.  Kristi Chambers 05/05/2017, 11:22 AM

## 2017-05-06 ENCOUNTER — Encounter (HOSPITAL_COMMUNITY): Payer: Self-pay | Admitting: Interventional Cardiology

## 2017-05-13 ENCOUNTER — Ambulatory Visit: Payer: Medicaid Other | Admitting: Nurse Practitioner

## 2017-05-13 ENCOUNTER — Encounter: Payer: Self-pay | Admitting: Nurse Practitioner

## 2017-05-13 VITALS — BP 120/80 | HR 67 | Ht 67.0 in | Wt 197.1 lb

## 2017-05-13 DIAGNOSIS — I2542 Coronary artery dissection: Secondary | ICD-10-CM | POA: Diagnosis not present

## 2017-05-13 DIAGNOSIS — Z9889 Other specified postprocedural states: Secondary | ICD-10-CM

## 2017-05-13 LAB — BASIC METABOLIC PANEL
BUN/Creatinine Ratio: 15 (ref 9–23)
BUN: 10 mg/dL (ref 6–20)
CO2: 23 mmol/L (ref 20–29)
Calcium: 9.5 mg/dL (ref 8.7–10.2)
Chloride: 100 mmol/L (ref 96–106)
Creatinine, Ser: 0.67 mg/dL (ref 0.57–1.00)
GFR calc Af Amer: 132 mL/min/{1.73_m2} (ref >59)
GFR calc non Af Amer: 114 mL/min/{1.73_m2} (ref >59)
Glucose: 96 mg/dL (ref 65–99)
Potassium: 4.8 mmol/L (ref 3.5–5.2)
Sodium: 137 mmol/L (ref 134–144)

## 2017-05-13 LAB — CBC
Hematocrit: 39.9 % (ref 34.0–46.6)
Hemoglobin: 13.5 g/dL (ref 11.1–15.9)
MCH: 29.5 pg (ref 26.6–33.0)
MCHC: 33.8 g/dL (ref 31.5–35.7)
MCV: 87 fL (ref 79–97)
Platelets: 429 10*3/uL — ABNORMAL HIGH (ref 150–379)
RBC: 4.58 x10E6/uL (ref 3.77–5.28)
RDW: 13.1 % (ref 12.3–15.4)
WBC: 11.1 10*3/uL — ABNORMAL HIGH (ref 3.4–10.8)

## 2017-05-13 NOTE — Patient Instructions (Addendum)
We will be checking the following labs today - BMET & CBC   Medication Instructions:    Continue with your current medicines.     Testing/Procedures To Be Arranged:  N/A  Follow-Up:   See Dr. Eden EmmsNishan in about 4 to 6 weeks with fasting labs & EKG on return.     Other Special Instructions:   N/A    If you need a refill on your cardiac medications before your next appointment, please call your pharmacy.   Call the [facility] office at [phone] if you have any questions, problems or concerns.

## 2017-05-13 NOTE — Progress Notes (Signed)
CARDIOLOGY OFFICE NOTE  Date:  05/13/2017    Kristi Chambers Date of Birth: 1981/04/19 Medical Record #[id number]  PCP:  Mikael SprayMcRae, Lauren, NP  Cardiologist:  Eden EmmsNishan  Chief Complaint  Patient presents with  . Coronary Artery Disease    Post hospital visit - seen for Dr. Eden EmmsNishan    History of Present Illness: Kristi PlumKristan N Kantor is a 37 y.o. female who presents today for a post hospital visit. This was to be a TOC visit - however, no phone call documented. Seen for Dr. Eden EmmsNishan.   She has a history of HLD, asthma, and ADD.   Amitted through the ED 05/03/17 with chest pain. Her initial ECG showed acute ST elevation I, AVL with reciprocal depression in 3,F. This ECG was not acted on. Her initial troponin was negative.  Eventually a repeat ECG around 17:00 showed normalization. Dr Eden EmmsNishan was then called immediately after the EKGs were reviewed by the ED MD. Her second Troponin at 17:44 was elevated -14.16. 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 LVF was 50-55% at cath. Plan was for ASA, statin, and B/P control.   It was noted that pt had recently been placed on Adderall for her ADD. Its felt this may have contributed and it was recommended that this be discontinued. Dr Rennis GoldenHilty saw her the morning of 12/30 and felt she could be discharged.   Comes in today. Here alone. Says she is doing ok. She is pretty much back to her regular routine. No more chest pain. Not short of breath. Feels ok on her medicines. She is trying to change her diet - is was pretty bad apparently - lots of junk and she loves sugar.  She is taking care of chickens, pigs, peacocks. She does not smoke. She has no real concerns today and feels like she is doing well.   Past Medical History:  Diagnosis Date  . Asthma     Past Surgical History:  Procedure Laterality Date  . LEFT HEART CATH AND CORONARY ANGIOGRAPHY N/A 05/03/2017   Procedure: LEFT HEART CATH AND CORONARY ANGIOGRAPHY;  Surgeon: Corky CraftsVaranasi, Jayadeep S, MD;  [facility];  Service: Cardiovascular;  Laterality: N/A;     Medications: Current Meds  Medication Sig  . acetaminophen (TYLENOL) 325 MG tablet Take 2 tablets (650 mg total) by mouth every 4 (four) hours as needed for headache or mild pain.  Marland Kitchen. albuterol (PROAIR HFA) 108 (90 Base) MCG/ACT inhaler Inhale 2 puffs into the lungs every 6 (six) hours as needed for wheezing or shortness of breath.  Marland Kitchen. aspirin EC 81 MG EC tablet Take 1 tablet (81 mg total) by mouth daily.  Marland Kitchen. atorvastatin (LIPITOR) 80 MG tablet Take 1 tablet (80 mg total) by mouth at bedtime.  . beclomethasone (QVAR) 80 MCG/ACT inhaler Inhale 2 puffs into the lungs 2 (two) times daily.  . carvedilol (COREG) 3.125 MG tablet Take 1 tablet (3.125 mg total) by mouth 2 (two) times daily with a meal.  . cetirizine (ZYRTEC) 10 MG tablet Take 10 mg by mouth at bedtime.  Marland Kitchen. levonorgestrel (MIRENA) 20 MCG/24HR IUD 1 each by Intrauterine route once. Implanted October 2017  . nitroGLYCERIN (NITROSTAT) 0.4 MG SL tablet Place 1 tablet (0.4 mg total) under the tongue every 5 (five) minutes x 3 doses as needed for chest pain.  Marland Kitchen. omeprazole (PRILOSEC) 40 MG capsule Take 40 mg by  mouth at bedtime.  Marland Kitchen venlafaxine XR (EFFEXOR-XR) 150 MG 24 hr capsule Take 150 mg by mouth daily. 150mg  in am and 75mg  at 1300  . venlafaxine XR (EFFEXOR-XR) 75 MG 24 hr capsule Take 75 mg by mouth daily after lunch.     Allergies: No Known Allergies  Social History: The patient  reports that she has quit smoking. She smoked 0.50 packs per day. she has never used smokeless tobacco. She reports that she drinks alcohol. She reports that she does not use drugs.   Family History: The patient's family history includes Heart attack in her father; Heart disease in her father; High blood pressure in her father and mother; Stroke in her father.   Review of Systems: Please  see the history of present illness.   Otherwise, the review of systems is positive for none.   All other systems are reviewed and negative.   Physical Exam: VS:  BP 120/80 (BP Location: Left Arm, Patient Position: Sitting, Cuff Size: Normal)   Pulse 67   Ht 5\' 7"  (1.702 m)   Wt 197 lb 1.9 oz (89.4 kg)   LMP 05/03/2017 (LMP Unknown)   BMI 30.87 kg/m  .  BMI Body mass index is 30.87 kg/m.  Wt Readings from Last 3 Encounters:  05/13/17 197 lb 1.9 oz (89.4 kg)  05/03/17 206 lb 12.7 oz (93.8 kg)  06/08/13 185 lb (83.9 kg)    General: Pleasant. Obese. Alert and in no acute distress.  Her weight is down 11 pounds.  HEENT: Normal.  Neck: Supple, no JVD, carotid bruits, or masses noted.  Cardiac: Regular rate and rhythm. No murmurs, rubs, or gallops. No edema.  Respiratory:  Lungs are clear to auscultation bilaterally with normal work of breathing.  GI: Soft and nontender.  MS: No deformity or atrophy. Gait and ROM intact.  Skin: Warm and dry. Color is normal.  Neuro:  Strength and sensation are intact and no gross focal deficits noted.  Psych: Alert, appropriate and with normal affect.   LABORATORY DATA:  EKG:  EKG is ordered today. This shows NSR - evolving anterolateral T wave changes/septal infarct.   Lab Results  Component Value Date   WBC 11.2 (H) 05/03/2017   HGB 13.9 05/03/2017   HCT 42.4 05/03/2017   PLT 411 (H) 05/03/2017   GLUCOSE 103 (H) 05/05/2017   CHOL 146 05/05/2017   TRIG 85 05/05/2017   HDL 32 (L) 05/05/2017   LDLCALC 97 05/05/2017   NA 134 (L) 05/05/2017   K 3.5 05/05/2017   CL 100 (L) 05/05/2017   CREATININE 0.68 05/05/2017   BUN 10 05/05/2017   CO2 25 05/05/2017     BNP (last 3 results) No results for input(s): BNP in the last 8760 hours.  ProBNP (last 3 results) No results for input(s): PROBNP in the last 8760 hours.   Other Studies Reviewed Today:  Echo Study Conclusions 04/2017  - Left ventricle: The cavity size was normal. Wall  thickness was   normal. Systolic function was normal. The estimated ejection   fraction was in the range of 50% to 55%. Distal anteroapical and   apical hypokinesis. Doppler parameters are consistent with   abnormal left ventricular relaxation (grade 1 diastolic   dysfunction). The E/e&' ratio is between 8-15, suggesting   indeterminate LV filling pressure. - Left atrium: The atrium was normal in size. - Inferior vena cava: The vessel was normal in size. The   respirophasic diameter changes were in the normal  range (>= 50%),   consistent with normal central venous pressure.  Impressions:  - LVEF 50-55%, normal wall thickness, distal anteroapical and   apical hypokinesis, grade 1 DD, indeterminate LV filling   pressure, normal LA size, normal IVC.     LEFT HEART CATH AND CORONARY ANGIOGRAPHY 04/2017  Conclusion     Prox LAD lesion is 10% stenosed.  Mid LAD to Dist LAD lesion is 40% stenosed. 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 There is TIMI 3 flow.  There is no aortic valve stenosis.  The left ventricular systolic function is normal.  LV end diastolic pressure is mildly elevated.  The left ventricular ejection fraction is 50-55% by visual estimate.   Avoid anticoagulation.  Continue aspirin 81 mg daily.  Control BP aggressively.  Watch in ICU.     Assessment/Plan:  1. 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 She is doing very well clinically with no symptoms. On low dose Coreg. On aspirin and statin as well. Needs follow up lab today.   2. ADD - recently placed on Adderal - this was recommended to be stopped - she will discuss other alternatives with her PCP. I would say that any stimulant should be avoided going forward.   3. Obesity - encouraged less carbs/sugars and more exercise.   4. Tobacco use - not smoking.   Current medicines are reviewed with the  patient today.  The patient does not have concerns regarding medicines other than what has been noted above.  The following changes have been made:  See above.  Labs/ tests ordered today include:   No orders of the defined types were placed in this encounter.    Disposition:   FU with Dr. Eden Emms with fasting labs/repeat EKG in about 4 to 6 weeks.    Patient is agreeable to this plan and will call if any problems develop in the interim.   SignedNorma Fredrickson, NP  05/13/2017 11:38 AM  [facility] [address] [place], [place]  [address] Phone: [phone] Fax: [phone]

## 2017-06-10 NOTE — Progress Notes (Signed)
CARDIOLOGY OFFICE NOTE  Date:  06/17/2017    Kristi Chambers Date of Birth: [birth year] Medical Record #[id number]  PCP:  Mikael Spray, NP  Cardiologist:  Eden Emms  No chief complaint on file.   History of Present Illness: Kristi Chambers is a 37 y.o. female  With history of HLD, asthma and ADD.    She has a history of HLD, asthma, and ADD.   Seen in ER 05/03/18 with chest pain . Had transient inferior ST elevation and reciprocal ST depression in I,AVL [place] 14.16 Cath films reviewed from Dr Abe People. 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 EF 50-55% Rx medically Echo reviewed 05/05/17 EF 50-55% apical hypokinesis no valve disease. Seen by NP 05/13/17 doing well With no chest pain or dyspnea. Back to cleaning houses. Non smoker Working on diet which is high in carbs and surgars She is taking care of chickens, pigs, peacocks. She does not smoke. She has no real concerns today and feels like she is doing well.   Doing well no chest pain   Past Medical History:  Diagnosis Date  . Asthma     Past Surgical History:  Procedure Laterality Date  . LEFT HEART CATH AND CORONARY ANGIOGRAPHY N/A 05/03/2017   Procedure: LEFT HEART CATH AND CORONARY ANGIOGRAPHY;  Surgeon: Corky Crafts, MD;  Location: [facility] INVASIVE CV LAB;  Service: Cardiovascular;  Laterality: N/A;     Medications: Current Meds  Medication Sig  . acetaminophen (TYLENOL) 325 MG tablet Take 2 tablets (650 mg total) by mouth every 4 (four) hours as needed for headache or mild pain.  Marland Kitchen albuterol (PROAIR HFA) 108 (90 Base) MCG/ACT inhaler Inhale 2 puffs into the lungs every 6 (six) hours as needed for wheezing or shortness of breath.  Marland Kitchen aspirin EC 81 MG EC tablet Take 1 tablet (81 mg total) by mouth daily.  Marland Kitchen atorvastatin (LIPITOR) 80 MG tablet Take 1 tablet (80 mg total) by mouth at bedtime.  . beclomethasone (QVAR) 80 MCG/ACT inhaler Inhale 2 puffs into the lungs 2 (two) times  daily.  . carvedilol (COREG) 3.125 MG tablet Take 1 tablet (3.125 mg total) by mouth 2 (two) times daily with a meal.  . cetirizine (ZYRTEC) 10 MG tablet Take 10 mg by mouth at bedtime.  Marland Kitchen levonorgestrel (MIRENA) 20 MCG/24HR IUD 1 each by Intrauterine route once. Implanted October 2017  . nitroGLYCERIN (NITROSTAT) 0.4 MG SL tablet Place 1 tablet (0.4 mg total) under the tongue every 5 (five) minutes x 3 doses as needed for chest pain.  Marland Kitchen omeprazole (PRILOSEC) 40 MG capsule Take 40 mg by mouth at bedtime.  Marland Kitchen venlafaxine XR (EFFEXOR-XR) 150 MG 24 hr capsule Take 150 mg by mouth daily. 150mg  in am and 75mg  at 1300  . venlafaxine XR (EFFEXOR-XR) 75 MG 24 hr capsule Take 75 mg by mouth daily after lunch.     Allergies: No Known Allergies  Social History: The patient  reports that she has quit smoking. She smoked 0.50 packs per day. she has never used smokeless tobacco. She reports that she drinks alcohol. She reports that she does not use drugs.   Family History: The patient's family history includes Heart attack in her father; Heart disease in her father; High blood pressure in her father and mother; Stroke in her father.   Review of Systems: Please see the history of present illness.   Otherwise, the review of systems is positive for none.  All other systems are reviewed and negative.   Physical Exam: VS:  BP 126/90   Pulse 72   Ht 5\' 7"  (1.702 m)   Wt 191 lb 3.2 oz (86.7 kg)   SpO2 98%   BMI 29.95 kg/m  .  BMI Body mass index is 29.95 kg/m.  Wt Readings from Last 3 Encounters:  06/17/17 191 lb 3.2 oz (86.7 kg)  05/13/17 197 lb 1.9 oz (89.4 kg)  05/03/17 206 lb 12.7 oz (93.8 kg)    Affect appropriate Healthy:  appears stated age HEENT: normal Neck supple with no adenopathy JVP normal no bruits no thyromegaly Lungs clear with no wheezing and good diaphragmatic motion Heart:  S1/S2 no murmur, no rub, gallop or click PMI normal Abdomen: benighn, BS positve, no tenderness, no  AAA no bruit.  No HSM or HJR Distal pulses intact with no bruits No edema Neuro non-focal Skin warm and dry No muscular weakness Tattoos on back and behind ears     LABORATORY DATA:  EKG:  05/13/17   NSR - evolving anterolateral T wave changes/septal infarct. 06/17/17 SR rate 63 normal  Lab Results  Component Value Date   WBC 11.1 (H) 05/13/2017   HGB 13.5 05/13/2017   HCT 39.9 05/13/2017   PLT 429 (H) 05/13/2017   GLUCOSE 96 05/13/2017   CHOL 146 05/05/2017   TRIG 85 05/05/2017   HDL 32 (L) 05/05/2017   LDLCALC 97 05/05/2017   NA 137 05/13/2017   K 4.8 05/13/2017   CL 100 05/13/2017   CREATININE 0.67 05/13/2017   BUN 10 05/13/2017   CO2 23 05/13/2017     BNP (last 3 results) No results for input(s): BNP in the last 8760 hours.  ProBNP (last 3 results) No results for input(s): PROBNP in the last 8760 hours.   Other Studies Reviewed Today:  Echo Study Conclusions 04/2017  - Left ventricle: The cavity size was normal. Wall thickness was   normal. Systolic function was normal. The estimated ejection   fraction was in the range of 50% to 55%. Distal anteroapical and   apical hypokinesis. Doppler parameters are consistent with   abnormal left ventricular relaxation (grade 1 diastolic   dysfunction). The E/e&' ratio is between 8-15, suggesting   indeterminate LV filling pressure. - Left atrium: The atrium was normal in size. - Inferior vena cava: The vessel was normal in size. The   respirophasic diameter changes were in the normal range (>= 50%),   consistent with normal central venous pressure.  Impressions:  - LVEF 50-55%, normal wall thickness, distal anteroapical and   apical hypokinesis, grade 1 DD, indeterminate LV filling   pressure, normal LA size, normal IVC.     LEFT HEART CATH AND CORONARY ANGIOGRAPHY 04/2017  Conclusion     Prox LAD lesion is 10% stenosed.  Mid LAD to Dist LAD lesion is 40% stenosed. 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 There is TIMI 3 flow.  There is no aortic valve stenosis.  The left ventricular systolic function is normal.  LV end diastolic pressure is mildly elevated.  The left ventricular ejection fraction is 50-55% by visual estimate.   Avoid anticoagulation.  Continue aspirin 81 mg daily.  Control BP aggressively.  Watch in ICU.     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 She is doing very well clinically with no symptoms. On low dose Coreg. On aspirin and statin as well.  2. ADD - recently placed on Adderal - this was recommended to be stopped - she will discuss other alternatives with her PCP. I would say that any stimulant should be avoided going forward.   3. Obesity - encouraged less carbs/sugars and more exercise.   4. Tobacco use - not smoking.   Charlton HawsPeter Tiffaney Heimann

## 2017-06-17 ENCOUNTER — Encounter (INDEPENDENT_AMBULATORY_CARE_PROVIDER_SITE_OTHER): Payer: Self-pay

## 2017-06-17 ENCOUNTER — Encounter: Payer: Self-pay | Admitting: Cardiovascular Disease

## 2017-06-17 ENCOUNTER — Ambulatory Visit: Payer: Medicaid Other | Admitting: Cardiovascular Disease

## 2017-06-17 VITALS — BP 126/90 | HR 72 | Ht 67.0 in | Wt 191.2 lb

## 2017-06-17 DIAGNOSIS — I2510 Atherosclerotic heart disease of native coronary artery without angina pectoris: Secondary | ICD-10-CM

## 2017-06-17 NOTE — Patient Instructions (Signed)

## 2017-12-23 ENCOUNTER — Encounter: Payer: Self-pay | Admitting: Nurse Practitioner

## 2017-12-23 ENCOUNTER — Encounter (INDEPENDENT_AMBULATORY_CARE_PROVIDER_SITE_OTHER): Payer: Self-pay

## 2017-12-23 ENCOUNTER — Ambulatory Visit: Payer: Medicaid Other | Admitting: Nurse Practitioner

## 2017-12-23 VITALS — BP 122/90 | HR 70 | Ht 67.0 in | Wt 199.8 lb

## 2017-12-23 DIAGNOSIS — I2510 Atherosclerotic heart disease of native coronary artery without angina pectoris: Secondary | ICD-10-CM

## 2017-12-23 DIAGNOSIS — I2542 Coronary artery dissection: Secondary | ICD-10-CM | POA: Diagnosis not present

## 2017-12-23 LAB — LIPID PANEL
Chol/HDL Ratio: 3.2 ratio (ref 0.0–4.4)
Cholesterol, Total: 135 mg/dL (ref 100–199)
HDL: 42 mg/dL (ref >39)
LDL Calculated: 72 mg/dL (ref 0–99)
Triglycerides: 105 mg/dL (ref 0–149)
VLDL Cholesterol Cal: 21 mg/dL (ref 5–40)

## 2017-12-23 LAB — HEPATIC FUNCTION PANEL
ALT: 19 IU/L (ref 0–32)
AST: 17 IU/L (ref 0–40)
Albumin: 4.3 g/dL (ref 3.5–5.5)
Alkaline Phosphatase: 72 IU/L (ref 39–117)
Bilirubin Total: 0.6 mg/dL (ref 0.0–1.2)
Bilirubin, Direct: 0.17 mg/dL (ref 0.00–0.40)
Total Protein: 6.7 g/dL (ref 6.0–8.5)

## 2017-12-23 LAB — BASIC METABOLIC PANEL
BUN/Creatinine Ratio: 11 (ref 9–23)
BUN: 7 mg/dL (ref 6–20)
CO2: 23 mmol/L (ref 20–29)
Calcium: 8.9 mg/dL (ref 8.7–10.2)
Chloride: 102 mmol/L (ref 96–106)
Creatinine, Ser: 0.63 mg/dL (ref 0.57–1.00)
GFR calc Af Amer: 133 mL/min/{1.73_m2} (ref >59)
GFR calc non Af Amer: 116 mL/min/{1.73_m2} (ref >59)
Glucose: 82 mg/dL (ref 65–99)
Potassium: 4.1 mmol/L (ref 3.5–5.2)
Sodium: 140 mmol/L (ref 134–144)

## 2017-12-23 NOTE — Patient Instructions (Addendum)
We will be checking the following labs today - BMET, Lipids and HPF   Medication Instructions:    Continue with your current medicines.     Testing/Procedures To Be Arranged:  N/A  Follow-Up:   See Dr. Nishan in 6 months with EKG andEden Chambers fasting labs    Other Special Instructions:   N/A    If you need a refill on your cardiac medications before your next appointment, please call your pharmacy.   Call the [facility] office at [phone] if you have any questions, problems or concerns.

## 2017-12-23 NOTE — Progress Notes (Signed)
CARDIOLOGY OFFICE NOTE  Date:  12/23/2017    Kristi Chambers Date of Birth: 1980-07-12 Medical Record #[id number]  PCP:  Mikael SprayMcRae, Lauren, NP  Cardiologist:  Eden EmmsNishan    Chief Complaint  Patient presents with  . Coronary Artery Disease    Follow up visit - seen for Dr. Eden EmmsNishan    History of Present Illness: Kristi Chambers is a 37 y.o. female who presents today for a 6 month check. Seen for Dr. Eden EmmsNishan.   She has a history of HLD, asthma, ADD, and CAD.   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 LVF was 50-55% at cath. Plan was for ASA, statin, and B/P control. Advised to stop her Adderall.   She was doing well at her visit with me back in January. Seen by Dr. Eden EmmsNishan in February and continued to do well.   Comes in today. Here alone. She feels good. No smoking. Enjoying her animals. No chest pain. Breathing is good. Not dizzy or lightheaded. Needs labs today. [place] [facility] [place] [facility] [place] most [place]. Overall, she is happy with how she is doing and has no real concerns.   Past Medical History:  Diagnosis Date  . Asthma     Past Surgical History:  Procedure Laterality Date  . LEFT HEART CATH AND CORONARY ANGIOGRAPHY N/A 05/03/2017   Procedure: LEFT HEART CATH AND CORONARY ANGIOGRAPHY;  Surgeon: Corky CraftsVaranasi, Jayadeep S, MD;  [facility];  Service: Cardiovascular;  Laterality: N/A;     Medications: Current Meds  Medication Sig  . acetaminophen (TYLENOL) 325 MG tablet Take 2 tablets (650 mg total) by mouth every 4 (four) hours as needed for headache or mild pain.  Marland Kitchen. albuterol (PROAIR HFA) 108 (90 Base) MCG/ACT inhaler Inhale 2 puffs into the lungs every 6 (six) hours as needed for wheezing or shortness of breath.  Marland Kitchen. aspirin EC 81 MG EC tablet Take 1 tablet (81 mg total) by  mouth daily.  Marland Kitchen. atorvastatin (LIPITOR) 40 MG tablet Take 40 mg by mouth daily.  . beclomethasone (QVAR) 80 MCG/ACT inhaler Inhale 2 puffs into the lungs 2 (two) times daily.  . carvedilol (COREG) 3.125 MG tablet Take 1 tablet (3.125 mg total) by mouth 2 (two) times daily with a meal.  . cetirizine (ZYRTEC) 10 MG tablet Take 10 mg by mouth at bedtime.  Marland Kitchen. levonorgestrel (MIRENA) 20 MCG/24HR IUD 1 each by Intrauterine route once. Implanted October 2017  . metFORMIN (GLUCOPHAGE) 500 MG tablet Take 500 mg by mouth daily.  . nitroGLYCERIN (NITROSTAT) 0.4 MG SL tablet Place 1 tablet (0.4 mg total) under the tongue every 5 (five) minutes x 3 doses as needed for chest pain.  Marland Kitchen. omeprazole (PRILOSEC) 40 MG capsule Take 40 mg by mouth at bedtime.  Marland Kitchen. venlafaxine XR (EFFEXOR-XR) 150 MG 24 hr capsule Take 150 mg by mouth daily. 150mg  in am and 75mg  at 1300  . venlafaxine XR (EFFEXOR-XR) 75 MG 24 hr capsule Take 75 mg by mouth daily after lunch.     Allergies: No Known Allergies  Social History: The patient  reports that she has quit smoking. She smoked 0.50 packs per day. She has never used smokeless tobacco. She reports that she drinks alcohol. She reports that she does not use drugs.   Family  History: The patient's family history includes Heart attack in her father; Heart disease in her father; High blood pressure in her father and mother; Stroke in her father.   Review of Systems: Please see the history of present illness.   Otherwise, the review of systems is positive for none.   All other systems are reviewed and negative.   Physical Exam: VS:  BP 122/90 (BP Location: Left Arm, Patient Position: Sitting, Cuff Size: Normal)   Pulse 70   Ht 5\' 7"  (1.702 m)   Wt 199 lb 12.8 oz (90.6 kg)   SpO2 98% Comment: at rest  BMI 31.29 kg/m  .  BMI Body mass index is 31.29 kg/m.  Wt Readings from Last 3 Encounters:  12/23/17 199 lb 12.8 oz (90.6 kg)  06/17/17 191 lb 3.2 oz (86.7 kg)  05/13/17 197 lb  1.9 oz (89.4 kg)    General: Pleasant. Well developed, well nourished and in no acute distress.   HEENT: Normal.  Neck: Supple, no JVD, carotid bruits, or masses noted.  Cardiac: Regular rate and rhythm. No murmurs, rubs, or gallops. No edema.  Respiratory:  Lungs are clear to auscultation bilaterally with normal work of breathing.  GI: Soft and nontender.  MS: No deformity or atrophy. Gait and ROM intact.  Skin: Warm and dry. Color is normal.  Neuro:  Strength and sensation are intact and no gross focal deficits noted.  Psych: Alert, appropriate and with normal affect.   LABORATORY DATA:  EKG:  EKG is not ordered today.  Lab Results  Component Value Date   WBC 11.1 (H) 05/13/2017   HGB 13.5 05/13/2017   HCT 39.9 05/13/2017   PLT 429 (H) 05/13/2017   GLUCOSE 96 05/13/2017   CHOL 146 05/05/2017   TRIG 85 05/05/2017   HDL 32 (L) 05/05/2017   LDLCALC 97 05/05/2017   NA 137 05/13/2017   K 4.8 05/13/2017   CL 100 05/13/2017   CREATININE 0.67 05/13/2017   BUN 10 05/13/2017   CO2 23 05/13/2017     BNP (last 3 results) No results for input(s): BNP in the last 8760 hours.  ProBNP (last 3 results) No results for input(s): PROBNP in the last 8760 hours.   Other Studies Reviewed Today:  Echo Study Conclusions 04/2017  - Left ventricle: The cavity size was normal. Wall thickness was normal. Systolic function was normal. The estimated ejection fraction was in the range of 50% to 55%. Distal anteroapical and apical hypokinesis. Doppler parameters are consistent with abnormal left ventricular relaxation (grade 1 diastolic dysfunction). The E/e&' ratio is between 8-15, suggesting indeterminate LV filling pressure. - Left atrium: The atrium was normal in size. - Inferior vena cava: The vessel was normal in size. The respirophasic diameter changes were in the normal range (>= 50%), consistent with normal central venous pressure.  Impressions:  - LVEF  50-55%, normal wall thickness, distal anteroapical and apical hypokinesis, grade 1 DD, indeterminate LV filling pressure, normal LA size, normal IVC.    LEFT HEART CATH AND CORONARY ANGIOGRAPHY 04/2017  Conclusion     Prox LAD lesion is 10% stenosed.  Mid LAD to Dist LAD lesion is 40% stenosed. 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 There is TIMI 3 flow.  There is no aortic valve stenosis.  The left ventricular systolic function is normal.  LV end diastolic pressure is mildly elevated.  The left ventricular ejection fraction is 50-55% by visual estimate.  Avoid anticoagulation. Continue aspirin 81 mg  daily. Control BP aggressively. Watch in ICU.     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 She continues to do well without recurrent symptoms.   2. HLD - on statin therapy - getting labs today.   3. Obesity - encouragement given.  4. Tobacco use - not smoking.   Current medicines are reviewed with the patient today.  The patient does not have concerns regarding medicines other than what has been noted above.  The following changes have been made:  See above.  Labs/ tests ordered today include:    Orders Placed This Encounter  Procedures  . Basic metabolic panel  . Hepatic function panel  . Lipid panel     Disposition:   FU with Dr. Eden Emms in 6 months.   Patient is agreeable to this plan and will call if any problems develop in the interim.   SignedNorma Fredrickson, NP  12/23/2017 9:12 AM  [facility] [address] [place], [place]  [address] Phone: [phone] Fax: [phone]

## 2018-06-23 ENCOUNTER — Other Ambulatory Visit (HOSPITAL_COMMUNITY): Payer: Self-pay | Admitting: Cardiology

## 2018-06-23 NOTE — Progress Notes (Deleted)
CARDIOLOGY OFFICE NOTE  Date:  06/23/2018    [name] Date of Birth: [birth year] Medical Record #[id number]  PCP:  Mikael Spray, NP  Cardiologist:  Eden Emms    No chief complaint on file.   History of Present Illness: Kristi Chambers is a 38 y.o. female  With  a history of HLD, asthma, ADD, and CAD.   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 LVF was 50-55% at cath. Plan was for ASA, statin, and B/P control. Advised to stop her Adderall.   She feels good. No smoking. Enjoying her animals. No chest pain. Breathing is good.  Boyfriend bought a [facility] and they have been going there most weekends. Overall, she is happy with how she is doing and has no real concerns.   ***  Past Medical History:  Diagnosis Date  . Asthma     Past Surgical History:  Procedure Laterality Date  . LEFT HEART CATH AND CORONARY ANGIOGRAPHY N/A 05/03/2017   Procedure: LEFT HEART CATH AND CORONARY ANGIOGRAPHY;  Surgeon: Corky Crafts, MD;  [facility] CV LAB;  Service: Cardiovascular;  Laterality: N/A;     Medications: No outpatient medications have been marked as taking for the 06/27/18 encounter (Appointment) with Wendall Stade, MD.     Allergies: No Known Allergies  Social History: The patient  reports that she has quit smoking. She smoked 0.50 packs per day. She has never used smokeless tobacco. She reports current alcohol use. She reports that she does not use drugs.   Family History: The patient's family history includes Heart attack in her father; Heart disease in her father; High blood pressure in her father and mother; Stroke in her father.   Review of Systems: Please see the history of present illness.   Otherwise, the review of systems is positive for none.   All other systems are reviewed and  negative.   Physical Exam: VS:  There were no vitals taken for this visit. Marland Kitchen  BMI There is no height or weight on file to calculate BMI.  Wt Readings from Last 3 Encounters:  12/23/17 90.6 kg  06/17/17 86.7 kg  05/13/17 89.4 kg   Affect appropriate Obese female  HEENT: normal Neck supple with no adenopathy JVP normal no bruits no thyromegaly Lungs clear with no wheezing and good diaphragmatic motion Heart:  S1/S2 no murmur, no rub, gallop or click PMI normal Abdomen: benighn, BS positve, no tenderness, no AAA no bruit.  No HSM or HJR Distal pulses intact with no bruits No edema Neuro non-focal Skin warm and dry No muscular weakness   LABORATORY DATA:  EKG:  EKG is not ordered today.  Lab Results  Component Value Date   WBC 11.1 (H) 05/13/2017   HGB 13.5 05/13/2017   HCT 39.9 05/13/2017   PLT 429 (H) 05/13/2017   GLUCOSE 82 12/23/2017   CHOL 135 12/23/2017   TRIG 105 12/23/2017   HDL 42 12/23/2017   LDLCALC 72 12/23/2017   ALT 19 12/23/2017   AST 17 12/23/2017   NA 140 12/23/2017   K 4.1 12/23/2017   CL 102 12/23/2017   CREATININE 0.63 12/23/2017   BUN 7 12/23/2017   CO2 23 12/23/2017     BNP (last 3 results) No results for input(s): BNP in the last 8760  hours.  ProBNP (last 3 results) No results for input(s): PROBNP in the last 8760 hours.   Other Studies Reviewed Today:  Echo Study Conclusions 04/2017  - Left ventricle: The cavity size was normal. Wall thickness was normal. Systolic function was normal. The estimated ejection fraction was in the range of 50% to 55%. Distal anteroapical and apical hypokinesis. Doppler parameters are consistent with abnormal left ventricular relaxation (grade 1 diastolic dysfunction). The E/e&' ratio is between 8-15, suggesting indeterminate LV filling pressure. - Left atrium: The atrium was normal in size. - Inferior vena cava: The vessel was normal in size. The respirophasic diameter changes  were in the normal range (>= 50%), consistent with normal central venous pressure.  Impressions:  - LVEF 50-55%, normal wall thickness, distal anteroapical and apical hypokinesis, grade 1 DD, indeterminate LV filling pressure, normal LA size, normal IVC.    LEFT HEART CATH AND CORONARY ANGIOGRAPHY 04/2017  Conclusion     Prox LAD lesion is 10% stenosed.  Mid LAD to Dist LAD lesion is 40% stenosed. 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 There is TIMI 3 flow.  There is no aortic valve stenosis.  The left ventricular systolic function is normal.  LV end diastolic pressure is mildly elevated.  The left ventricular ejection fraction is 50-55% by visual estimate.  Avoid anticoagulation. Continue aspirin 81 mg daily. Control BP aggressively. Watch in ICU.     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 She continues to do well without recurrent symptoms.   2. HLD - on statin therapy - getting labs today.   3. Obesity - discussed exercise and low carb diet   4. Tobacco use - quit   Current medicines are reviewed with the patient today.  The patient does not have concerns regarding medicines other than what has been noted above.  The following changes have been made:  See above.  Labs/ tests ordered today include:    No orders of the defined types were placed in this encounter.    Disposition:   FU with me in a year    Signed: Charlton Haws, MD  06/23/2018 10:33 AM

## 2018-06-27 ENCOUNTER — Ambulatory Visit: Payer: Medicaid Other | Admitting: Cardiovascular Disease

## 2018-07-04 NOTE — Progress Notes (Signed)
CARDIOLOGY OFFICE NOTE  Date:  07/09/2018    Kristi Chambers Date of Birth: Jun 11, 1980 Medical Record #[id number]  PCP:  Mikael Spray, NP  Cardiologist:  Eden Emms    No chief complaint on file.   History of Present Illness: Kristi Chambers is a 38 y.o. female  With  a history of HLD, asthma, ADD, and CAD.   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 LVF was 50-55% at cath. Plan was for ASA, statin, and B/P control. Advised to stop her Adderall.   She feels good. No smoking. Enjoying her animals. No chest pain. Breathing is good.  Boyfriend bought a place at [facility] and they have been going there most weekends. Overall, she is happy with how she is doing and has no real concerns.   Son is 70 and is in band and doing boy scouts   Past Medical History:  Diagnosis Date  . Asthma     Past Surgical History:  Procedure Laterality Date  . LEFT HEART CATH AND CORONARY ANGIOGRAPHY N/A 05/03/2017   Procedure: LEFT HEART CATH AND CORONARY ANGIOGRAPHY;  Surgeon: Corky Crafts, MD;  Location: [facility] INVASIVE CV LAB;  Service: Cardiovascular;  Laterality: N/A;     Medications: Current Meds  Medication Sig  . acetaminophen (TYLENOL) 325 MG tablet Take 2 tablets (650 mg total) by mouth every 4 (four) hours as needed for headache or mild pain.  Marland Kitchen albuterol (PROAIR HFA) 108 (90 Base) MCG/ACT inhaler Inhale 2 puffs into the lungs every 6 (six) hours as needed for wheezing or shortness of breath.  Marland Kitchen aspirin EC 81 MG EC tablet Take 1 tablet (81 mg total) by mouth daily.  Marland Kitchen atorvastatin (LIPITOR) 40 MG tablet Take 40 mg by mouth daily.  . beclomethasone (QVAR) 80 MCG/ACT inhaler Inhale 2 puffs into the lungs 2 (two) times daily.  . carvedilol (COREG) 3.125 MG tablet TAKE 1 TABLET BY MOUTH TWICE DAILY WITH MEALS  . cetirizine (ZYRTEC) 10 MG  tablet Take 10 mg by mouth at bedtime.  . DULoxetine (CYMBALTA) 30 MG capsule Take 30 mg by mouth at bedtime.  Marland Kitchen levonorgestrel (MIRENA) 20 MCG/24HR IUD 1 each by Intrauterine route once. Implanted October 2017  . lisinopril (PRINIVIL,ZESTRIL) 10 MG tablet Take 10 mg by mouth daily.  . nitroGLYCERIN (NITROSTAT) 0.4 MG SL tablet Place 1 tablet (0.4 mg total) under the tongue every 5 (five) minutes x 3 doses as needed for chest pain.  Marland Kitchen omeprazole (PRILOSEC) 40 MG capsule Take 40 mg by mouth at bedtime.  . [DISCONTINUED] metFORMIN (GLUCOPHAGE) 500 MG tablet Take 500 mg by mouth daily.  . [DISCONTINUED] venlafaxine XR (EFFEXOR-XR) 150 MG 24 hr capsule Take 150 mg by mouth daily. 150mg  in am and 75mg  at 1300  . [DISCONTINUED] venlafaxine XR (EFFEXOR-XR) 75 MG 24 hr capsule Take 75 mg by mouth daily after lunch.     Allergies: No Known Allergies  Social History: The patient  reports that she has quit smoking. She smoked 0.50 packs per day. She has never used smokeless tobacco. She reports current alcohol use. She reports that she does not use drugs.   Family History: The patient's family history includes Heart attack in her father; Heart disease in her father; High blood pressure in her father and mother; Stroke in her father.   Review  of Systems: Please see the history of present illness.   Otherwise, the review of systems is positive for none.   All other systems are reviewed and negative.   Physical Exam: VS:  BP 100/82   Pulse (!) 58   Ht  (1.702 m)   Wt 94.3 kg   SpO2 96%   BMI 32.55 kg/m  .  BMI Body mass index is 32.55 kg/m.  Wt Readings from Last 3 Encounters:  07/09/18 94.3 kg  12/23/17 90.6 kg  06/17/17 86.7 kg   Affect appropriate Obese female  HEENT: normal Neck supple with no adenopathy JVP normal no bruits no thyromegaly Lungs clear with no wheezing and good diaphragmatic motion Heart:  S1/S2 no murmur, no rub, gallop or click PMI normal Abdomen: benighn,  BS positve, no tenderness, no AAA no bruit.  No HSM or HJR Distal pulses intact with no bruits No edema Neuro non-focal Skin warm and dry No muscular weakness   LABORATORY DATA:  EKG:  3.4/20 SR rate 58 normal ECG   Lab Results  Component Value Date   WBC 11.1 (H) 05/13/2017   HGB 13.5 05/13/2017   HCT 39.9 05/13/2017   PLT 429 (H) 05/13/2017   GLUCOSE 82 12/23/2017   CHOL 135 12/23/2017   TRIG 105 12/23/2017   HDL 42 12/23/2017   LDLCALC 72 12/23/2017   ALT 19 12/23/2017   AST 17 12/23/2017   NA 140 12/23/2017   K 4.1 12/23/2017   CL 102 12/23/2017   CREATININE 0.63 12/23/2017   BUN 7 12/23/2017   CO2 23 12/23/2017     BNP (last 3 results) No results for input(s): BNP in the last 8760 hours.  ProBNP (last 3 results) No results for input(s): PROBNP in the last 8760 hours.   Other Studies Reviewed Today:  Echo Study Conclusions 04/2017  - Left ventricle: The cavity size was normal. Wall thickness was normal. Systolic function was normal. The estimated ejection fraction was in the range of 50% to 55%. Distal anteroapical and apical hypokinesis. Doppler parameters are consistent with abnormal left ventricular relaxation (grade 1 diastolic dysfunction). The E/e&' ratio is between 8-15, suggesting indeterminate LV filling pressure. - Left atrium: The atrium was normal in size. - Inferior vena cava: The vessel was normal in size. The respirophasic diameter changes were in the normal range (>= 50%), consistent with normal central venous pressure.  Impressions:  - LVEF 50-55%, normal wall thickness, distal anteroapical and apical hypokinesis, grade 1 DD, indeterminate LV filling pressure, normal LA size, normal IVC.    LEFT HEART CATH AND CORONARY ANGIOGRAPHY 04/2017  Conclusion     Prox LAD lesion is 10% stenosed.  Mid LAD to Dist LAD lesion is 40% stenosed. 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  There is TIMI 3 flow.  There is no aortic valve stenosis.  The left ventricular systolic function is normal.  LV end diastolic pressure is mildly elevated.  The left ventricular ejection fraction is 50-55% by visual estimate.  Avoid anticoagulation. Continue aspirin 81 mg daily. Control BP aggressively. Watch in ICU.     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 She continues to do well without recurrent symptoms.   2. HLD - on statin therapy - getting labs today.   3. Obesity - discussed exercise and low carb diet   4.  Tobacco use - quit   Current medicines are reviewed with the patient today.  The patient does not have concerns regarding medicines other than what has been noted above.  The following changes have been made:  See above.  Labs/ tests ordered today include:    Orders Placed This Encounter  Procedures  . EKG 12-Lead     Disposition:   FU with me in a year    Signed: Charlton Haws, MD  07/09/2018 10:52 AM

## 2018-07-09 ENCOUNTER — Encounter: Payer: Self-pay | Admitting: Cardiovascular Disease

## 2018-07-09 ENCOUNTER — Ambulatory Visit: Payer: Medicaid Other | Admitting: Cardiovascular Disease

## 2018-07-09 VITALS — BP 100/82 | HR 58 | Ht 67.0 in | Wt 207.8 lb

## 2018-07-09 DIAGNOSIS — I2510 Atherosclerotic heart disease of native coronary artery without angina pectoris: Secondary | ICD-10-CM | POA: Diagnosis not present

## 2018-07-09 DIAGNOSIS — I2542 Coronary artery dissection: Secondary | ICD-10-CM

## 2018-07-09 NOTE — Patient Instructions (Signed)
Medication Instructions:  Your physician recommends that you continue on your current medications as directed. Please refer to the Current Medication list given to you today.  If you need a refill on your cardiac medications before your next appointment, please call your pharmacy.   Lab work: NONE If you have labs (blood work) drawn today and your tests are completely normal, you will receive your results only by: . MyChart Message (if you have MyChart) OR . A paper copy in the mail If you have any lab test that is abnormal or we need to change your treatment, we will call you to review the results.  Testing/Procedures: NONE  Follow-Up: At [facility], you and your health needs are our priority.  As part of our continuing mission to provide you with exceptional heart care, we have created designated Provider Care Teams.  These Care Teams include your primary Cardiologist (physician) and Advanced Practice Providers (APPs -  Physician Assistants and Nurse Practitioners) who all work together to provide you with the care you need, when you need it. You will need a follow up appointment in 12 months.  Please call our office 2 months in advance to schedule this appointment.  You may see Peter Nishan, MD or one of the following Advanced Practice Providers on your designated Care Team:   Lori Gerhardt, NP Laura Ingold, NP . Jill McDaniel, NP  Any Other Special Instructions Will Be Listed Below (If Applicable).    

## 2018-10-03 ENCOUNTER — Other Ambulatory Visit: Payer: Self-pay | Admitting: Cardiology

## 2018-10-08 IMAGING — CR DG CHEST 2V
2 series · 2 of 2 positions shown · non-contrast
Comparison: None.

CLINICAL DATA: Pt c/o mid-sternal chest pains, left arm and jaw
numbness and tingling x today. Hx asthma.

EXAM:
CHEST  2 VIEW

[chest pa]
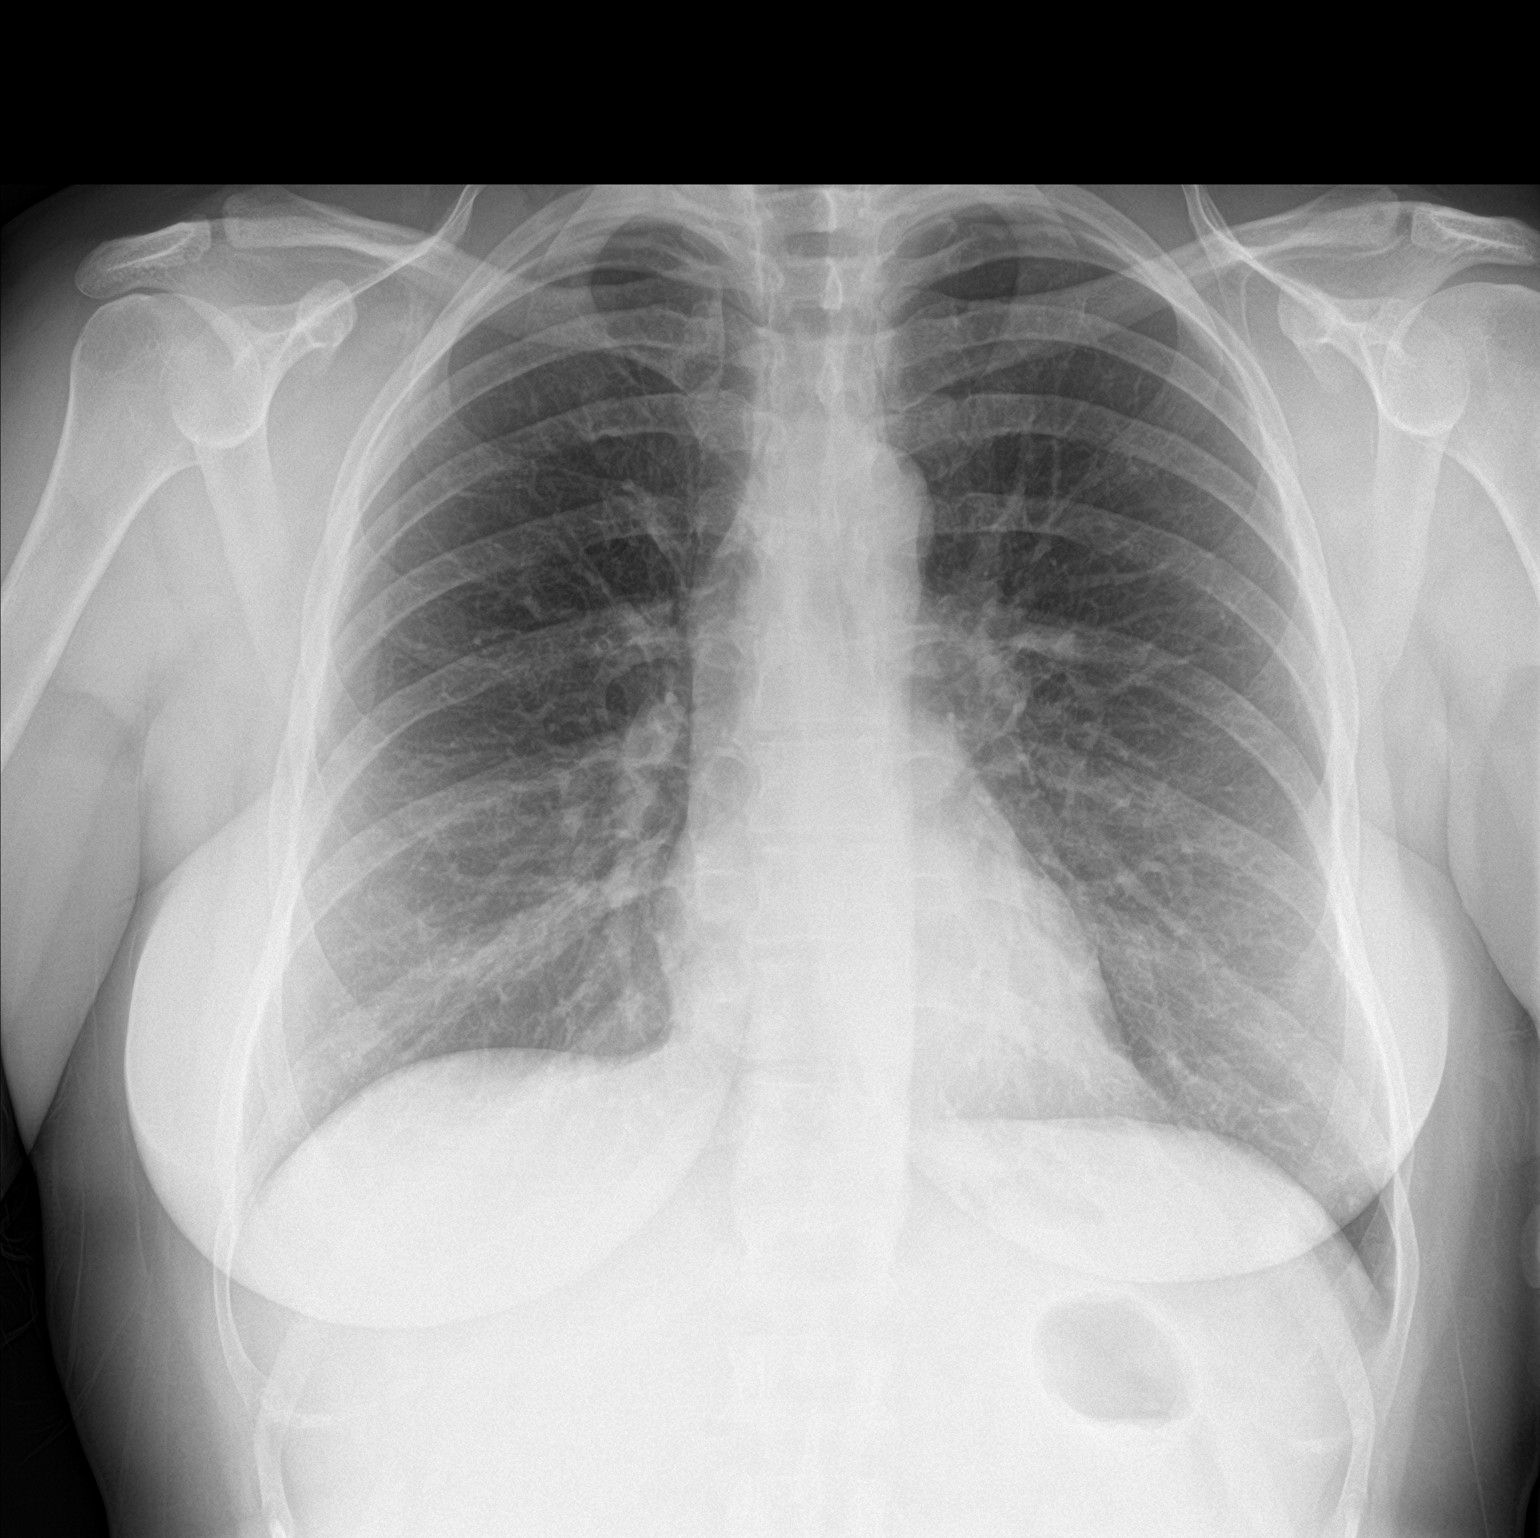

[chest lat]
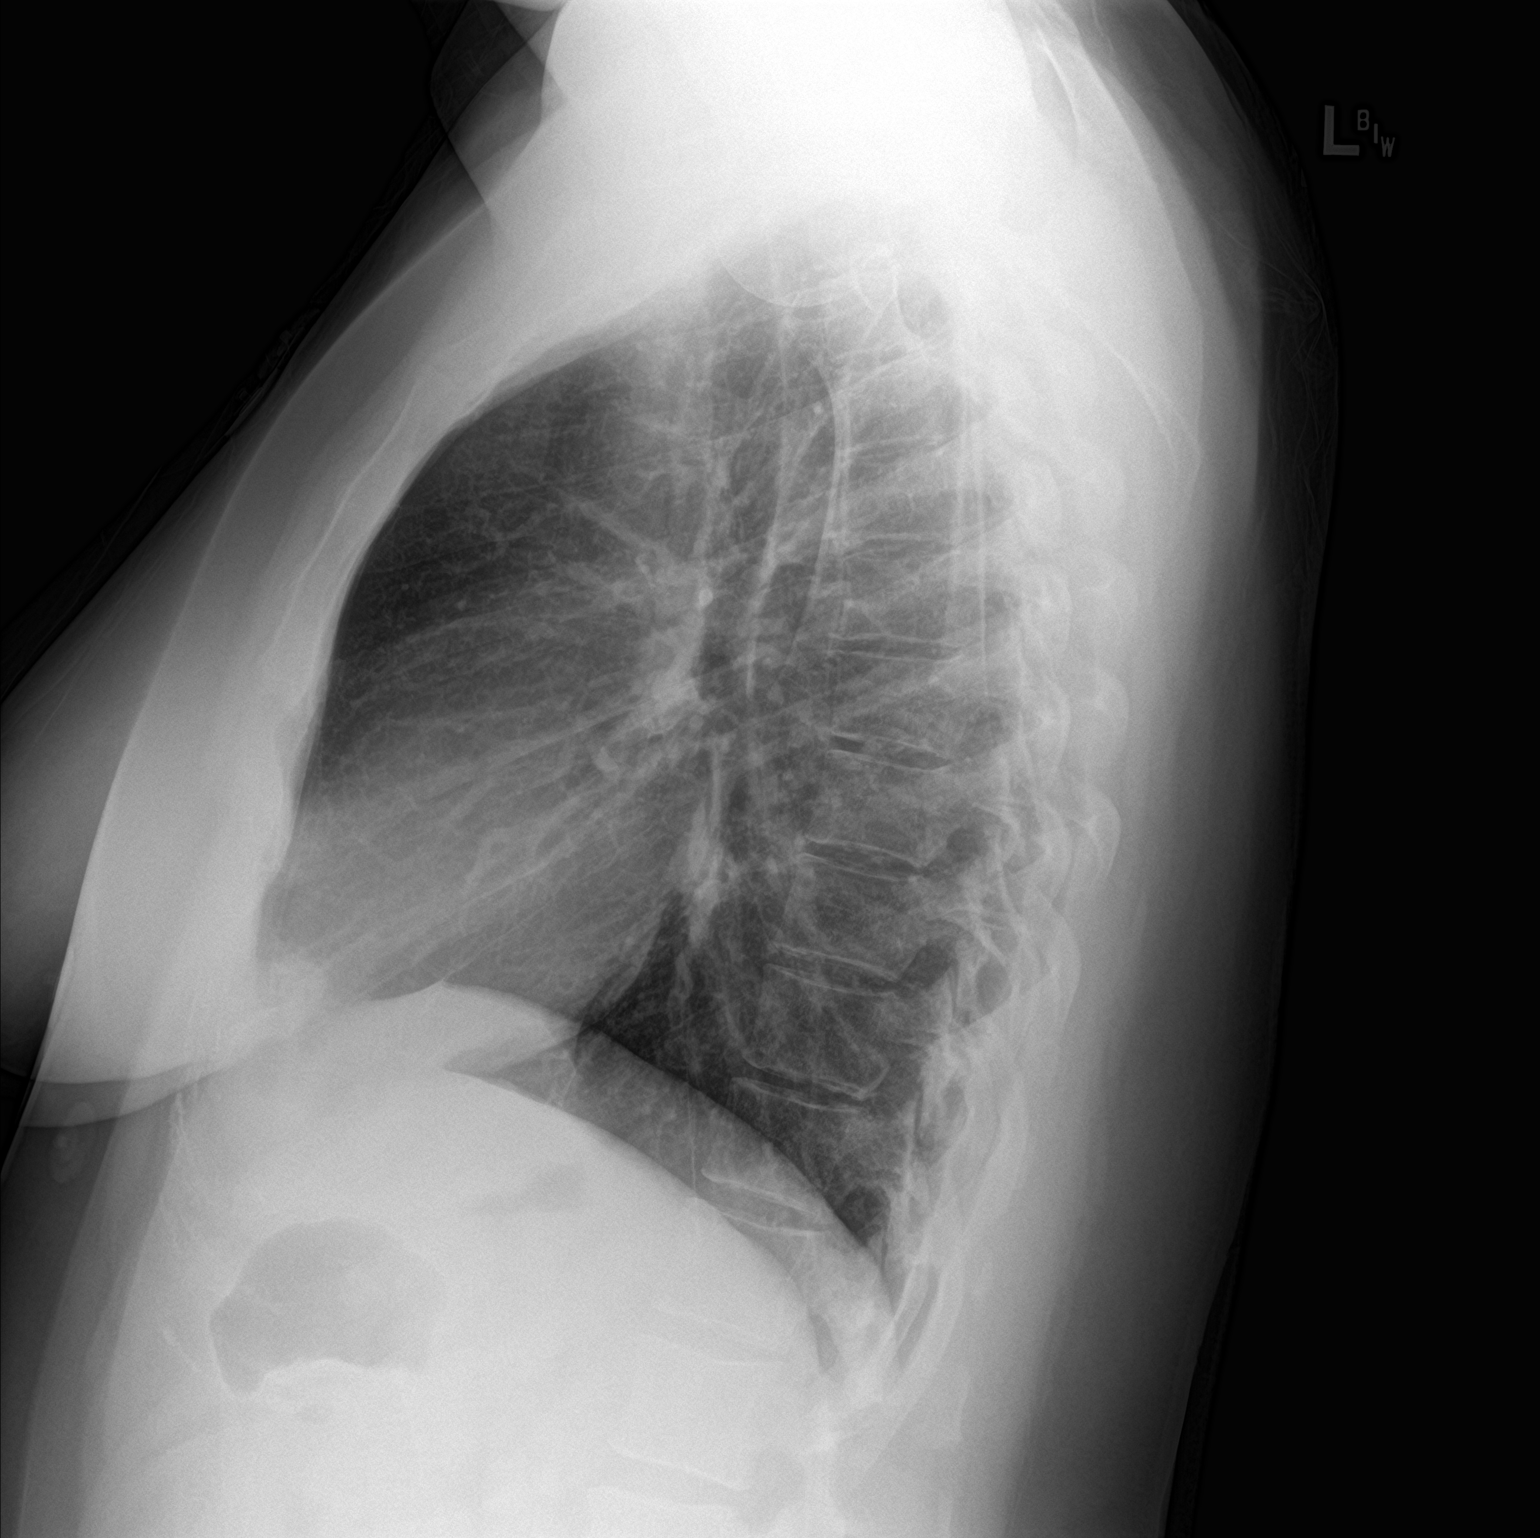

[2 of 2 positions shown; findings below may reference images not displayed]

FINDINGS: Normal mediastinum and cardiac silhouette. Normal pulmonary
vasculature. No evidence of effusion, infiltrate, or pneumothorax.
No acute bony abnormality.
IMPRESSION: No acute cardiopulmonary process.

## 2019-05-05 ENCOUNTER — Other Ambulatory Visit: Payer: Self-pay | Admitting: Cardiology

## 2019-07-15 NOTE — Progress Notes (Signed)
CARDIOLOGY OFFICE NOTE  Date:  07/29/2019    Kristi Chambers Date of Birth: [birth year] Medical Record #[id number]  PCP:  Welford Roche, NP  Cardiologist:  Johnsie Cancel    No chief complaint on file.   History of Present Illness: Kristi Chambers is a 39 y.o. female  With  a history of HLD, asthma, ADD, and CAD.   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 LVF was 50-55% at cath. Plan was for ASA, statin, and B/P control. Advised to stop her Adderall.   She feels good. Not smoking. Enjoying her animals. No chest pain or dyspnea.  Boyfriend bought a place at [facility] and they have been going there most weekends.     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 Asthma     Past Surgical History:  Procedure Laterality Date  . LEFT HEART CATH AND CORONARY ANGIOGRAPHY N/A 05/03/2017   Procedure: LEFT HEART CATH AND CORONARY ANGIOGRAPHY;  Surgeon: Jettie Booze, MD;  Location: [facility];  Service: Cardiovascular;  Laterality: N/A;     Medications: Current Meds  Medication Sig  . acetaminophen (TYLENOL) 325 MG tablet Take 2 tablets (650 mg total) by mouth every 4 (four) hours as needed for headache or mild pain.  Marland Kitchen albuterol (PROAIR HFA) 108 (90 Base) MCG/ACT inhaler Inhale 2 puffs into the lungs every 6 (six) hours as needed for wheezing or shortness of breath.  Marland Kitchen aspirin EC 81 MG EC tablet Take 1 tablet (81 mg total) by mouth daily.  Marland Kitchen atorvastatin (LIPITOR) 40 MG tablet Take 40 mg by mouth daily.  . carvedilol (COREG) 3.125 MG tablet TAKE 1 TABLET BY MOUTH TWICE DAILY WITH MEALS  . cetirizine (ZYRTEC) 10 MG tablet Take 10 mg by mouth at bedtime.  . DULoxetine  (CYMBALTA) 30 MG capsule Take 30 mg by mouth at bedtime.  Marland Kitchen levonorgestrel (MIRENA) 20 MCG/24HR IUD 1 each by Intrauterine route once. Implanted October 2017  . lisinopril (PRINIVIL,ZESTRIL) 10 MG tablet Take 10 mg by mouth daily.  . nitroGLYCERIN (NITROSTAT) 0.4 MG SL tablet Place 1 tablet (0.4 mg total) under the tongue every 5 (five) minutes x 3 doses as needed for chest pain.  Marland Kitchen omeprazole (PRILOSEC) 40 MG capsule Take 40 mg by mouth at bedtime.  . SYMBICORT 80-4.5 MCG/ACT inhaler Inhale 2 puffs into the lungs 2 (two) times daily.     Allergies: No Known Allergies  Social History: The patient  reports that she has quit smoking. She smoked 0.50 packs per day. She has never used smokeless tobacco. She reports current alcohol use. She reports that she does not use drugs.   Family History: The patient's family history includes Heart attack in her father; Heart disease in her father; High blood pressure in her father and mother; Stroke in her father.   Review of Systems: Please see the history of present illness.   Otherwise, the review of systems is positive for none.   All other systems are reviewed and negative.   Physical Exam: VS:  BP 118/76   Pulse 70   Ht 5\' 7"  (1.702 m)   Wt  200 lb (90.7 kg)   LMP  (LMP Unknown)   SpO2 98%   BMI 31.32 kg/m  .  BMI Body mass index is 31.32 kg/m.  Wt Readings from Last 3 Encounters:  07/29/19 200 lb (90.7 kg)  07/09/18 207 lb 12.8 oz (94.3 kg)  12/23/17 199 lb 12.8 oz (90.6 kg)   Affect appropriate Obese female  HEENT: normal Neck supple with no adenopathy JVP normal no bruits no thyromegaly Lungs clear with no wheezing and good diaphragmatic motion Heart:  S1/S2 no murmur, no rub, gallop or click PMI normal Abdomen: benighn, BS positve, no tenderness, no AAA no bruit.  No HSM or HJR Distal pulses intact with no bruits No edema Neuro non-focal Skin warm and dry No muscular weakness   LABORATORY DATA:  EKG:  3.4/20 SR rate  58 normal ECG 07/29/19 SR rate 70 normal   Lab Results  Component Value Date   WBC 9.9 07/21/2019   HGB 13.5 07/21/2019   HCT 41.2 07/21/2019   PLT 303 07/21/2019   GLUCOSE 108 (H) 07/21/2019   CHOL 135 12/23/2017   TRIG 105 12/23/2017   HDL 42 12/23/2017   LDLCALC 72 12/23/2017   ALT 22 07/21/2019   AST 21 07/21/2019   NA 140 07/21/2019   K 3.8 07/21/2019   CL 103 07/21/2019   CREATININE 0.67 07/21/2019   BUN 11 07/21/2019   CO2 24 07/21/2019     BNP (last 3 results) No results for input(s): BNP in the last 8760 hours.  ProBNP (last 3 results) No results for input(s): PROBNP in the last 8760 hours.   Other Studies Reviewed Today:  Echo Study Conclusions 04/2017  - Left ventricle: The cavity size was normal. Wall thickness was normal. Systolic function was normal. The estimated ejection fraction was in the range of 50% to 55%. Distal anteroapical and apical hypokinesis. Doppler parameters are consistent with abnormal left ventricular relaxation (grade 1 diastolic dysfunction). The E/e&' ratio is between 8-15, suggesting indeterminate LV filling pressure. - Left atrium: The atrium was normal in size. - Inferior vena cava: The vessel was normal in size. The respirophasic diameter changes were in the normal range (>= 50%), consistent with normal central venous pressure.  Impressions:  - LVEF 50-55%, normal wall thickness, distal anteroapical and apical hypokinesis, grade 1 DD, indeterminate LV filling pressure, normal LA size, normal IVC.    LEFT HEART CATH AND CORONARY ANGIOGRAPHY 04/2017  Conclusion     Prox LAD lesion is 10% stenosed.  Mid LAD to Dist LAD lesion is 40% stenosed. 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 There is TIMI 3 flow.  There is no aortic valve stenosis.  The left ventricular systolic function is normal.  LV end diastolic pressure is mildly elevated.  The left ventricular  ejection fraction is 50-55% by visual estimate.  Avoid anticoagulation. Continue aspirin 81 mg daily. Control BP aggressively. Watch in ICU.     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 She continues to do well without recurrent symptoms.   2. HLD - on statin therapy -  Needs updated labs LDL at goal in August 2019   3. Obesity - discussed exercise and low carb diet   4. Tobacco use - quit   Current medicines are reviewed with the patient today.  The patient does not have concerns regarding medicines  other than what has been noted above.  The following changes have been made:  See above.  Labs/ tests ordered today include: none will get with primary tomorrow    No orders of the defined types were placed in this encounter.    Disposition:   FU with me in a year    Signed: Charlton Haws, MD  07/29/2019 8:49 AM

## 2019-07-21 ENCOUNTER — Emergency Department (HOSPITAL_COMMUNITY)
Admission: EM | Admit: 2019-07-21 | Discharge: 2019-07-21 | Disposition: A | Discharge status: Home / Self Care | Payer: Medicaid Other | Attending: Emergency Medicine | Admitting: Emergency Medicine

## 2019-07-21 ENCOUNTER — Encounter (HOSPITAL_COMMUNITY): Payer: Self-pay | Admitting: Emergency Medicine

## 2019-07-21 ENCOUNTER — Emergency Department (HOSPITAL_COMMUNITY): Payer: Medicaid Other

## 2019-07-21 ENCOUNTER — Other Ambulatory Visit: Payer: Self-pay

## 2019-07-21 DIAGNOSIS — I2510 Atherosclerotic heart disease of native coronary artery without angina pectoris: Secondary | ICD-10-CM | POA: Diagnosis not present

## 2019-07-21 DIAGNOSIS — R519 Headache, unspecified: Secondary | ICD-10-CM | POA: Diagnosis present

## 2019-07-21 DIAGNOSIS — Z79899 Other long term (current) drug therapy: Secondary | ICD-10-CM | POA: Diagnosis not present

## 2019-07-21 DIAGNOSIS — J45901 Unspecified asthma with (acute) exacerbation: Secondary | ICD-10-CM | POA: Diagnosis not present

## 2019-07-21 DIAGNOSIS — R0789 Other chest pain: Secondary | ICD-10-CM | POA: Diagnosis not present

## 2019-07-21 DIAGNOSIS — R112 Nausea with vomiting, unspecified: Secondary | ICD-10-CM | POA: Insufficient documentation

## 2019-07-21 LAB — COMPREHENSIVE METABOLIC PANEL
ALT: 22 U/L (ref 0–44)
AST: 21 U/L (ref 15–41)
Albumin: 3.9 g/dL (ref 3.5–5.0)
Alkaline Phosphatase: 64 U/L (ref 38–126)
Anion gap: 13 (ref 5–15)
BUN: 11 mg/dL (ref 6–20)
CO2: 24 mmol/L (ref 22–32)
Calcium: 8.9 mg/dL (ref 8.9–10.3)
Chloride: 103 mmol/L (ref 98–111)
Creatinine, Ser: 0.67 mg/dL (ref 0.44–1.00)
GFR calc Af Amer: >60 mL/min (ref >60)
GFR calc non Af Amer: >60 mL/min (ref >60)
Glucose, Bld: 108 mg/dL — ABNORMAL HIGH (ref 70–99)
Potassium: 3.8 mmol/L (ref 3.5–5.1)
Sodium: 140 mmol/L (ref 135–145)
Total Bilirubin: 0.7 mg/dL (ref 0.3–1.2)
Total Protein: 6.4 g/dL — ABNORMAL LOW (ref 6.5–8.1)

## 2019-07-21 LAB — URINALYSIS, ROUTINE W REFLEX MICROSCOPIC
Bacteria, UA: NONE SEEN
Bilirubin Urine: NEGATIVE
Glucose, UA: NEGATIVE mg/dL
Hgb urine dipstick: NEGATIVE
Ketones, ur: NEGATIVE mg/dL
Nitrite: NEGATIVE
Protein, ur: 30 mg/dL — AB
Specific Gravity, Urine: 1.023 (ref 1.005–1.030)
pH: 6 (ref 5.0–8.0)

## 2019-07-21 LAB — CBC
HCT: 41.2 % (ref 36.0–46.0)
Hemoglobin: 13.5 g/dL (ref 12.0–15.0)
MCH: 30.2 pg (ref 26.0–34.0)
MCHC: 32.8 g/dL (ref 30.0–36.0)
MCV: 92.2 fL (ref 80.0–100.0)
Platelets: 303 10*3/uL (ref 150–400)
RBC: 4.47 MIL/uL (ref 3.87–5.11)
RDW: 12.5 % (ref 11.5–15.5)
WBC: 9.9 10*3/uL (ref 4.0–10.5)
nRBC: 0 % (ref 0.0–0.2)

## 2019-07-21 LAB — LIPASE, BLOOD: Lipase: 24 U/L (ref 11–51)

## 2019-07-21 LAB — I-STAT BETA HCG BLOOD, ED (MC, WL, AP ONLY): I-stat hCG, quantitative: <5 m[IU]/mL (ref <5)

## 2019-07-21 MED ORDER — PREDNISONE 20 MG PO TABS: 40.0000 mg | ORAL_TABLET | Freq: Every day | ORAL | 0 refills | Status: DC

## 2019-07-21 MED ORDER — PREDNISONE 20 MG PO TABS
60.0000 mg | ORAL_TABLET | Freq: Once | ORAL | Status: AC
  Administered 2019-07-21: 60 mg via ORAL
  Filled 2019-07-21: qty 3

## 2019-07-21 MED ORDER — SODIUM CHLORIDE 0.9% FLUSH: 3.0000 mL | Freq: Once | INTRAVENOUS | Status: DC

## 2019-07-21 NOTE — ED Triage Notes (Addendum)
Pt reports feeling like she has been having an asthma flare for the past 3 days, has been using rescue inhaler mutliple times a day as well as neb treatments- supposed to have pcp visit this morning for asthma. No sob or resp distress in triage. Also reports waking up a few hours ago with vomiting and headache, denies diarrhea, fevers or abd pain.

## 2019-07-21 NOTE — Discharge Instructions (Signed)
Please read and follow all provided instructions.  Your diagnoses today include:  1. Acute nonintractable headache, unspecified headache type   2. Exacerbation of asthma, unspecified asthma severity, unspecified whether persistent     Tests performed today include:  Chest x-ray - is normal  EKG - normal  Vital signs. See below for your results today.   Medications:   Prednisone - steroid medicine   It is best to take this medication in the morning to prevent sleeping problems. If you are diabetic, monitor your blood sugar closely and stop taking Prednisone if blood sugar is over 300. Take with food to prevent stomach upset.   Take any prescribed medications only as directed.  Additional information:  Follow any educational materials contained in this packet.  You are having a headache. No specific cause was found today for your headache. It may have been a migraine or other cause of headache. Stress, anxiety, fatigue, and depression are common triggers for headaches.   Your headache today does not appear to be life-threatening or require hospitalization, but often the exact cause of headaches is not determined in the emergency department. Therefore, follow-up with your doctor is very important to find out what may have caused your headache and whether or not you need any further diagnostic testing or treatment.   Sometimes headaches can appear benign (not harmful), but then more serious symptoms can develop which should prompt an immediate re-evaluation by your doctor or the emergency department.  BE VERY CAREFUL not to take multiple medicines containing Tylenol (also called acetaminophen). Doing so can lead to an overdose which can damage your liver and cause liver failure and possibly death.   Follow-up instructions: Please follow-up with your primary care provider in the next 3 days for further evaluation of your symptoms.   Return instructions:   Please return to the  Emergency Department if you experience worsening symptoms.  Return if the medications do not resolve your headache, if it recurs, or if you have multiple episodes of vomiting or cannot keep down fluids.  Return if you have a change from the usual headache.  RETURN IMMEDIATELY IF you:  Develop a sudden, severe headache  Develop confusion or become poorly responsive or faint  Develop a fever above 100.33F or problem breathing  Have a change in speech, vision, swallowing, or understanding  Develop new weakness, numbness, tingling, incoordination in your arms or legs  Have a seizure  Please return if you have any other emergent concerns.  Additional Information:  Your vital signs today were: BP (!) 149/89 (BP Location: Right Arm)   Pulse 93   Temp 97.9 F (36.6 C) (Oral)   Resp 16   LMP  (LMP Unknown)   SpO2 98%  If your blood pressure (BP) was elevated above 135/85 this visit, please have this repeated by your doctor within one month. --------------

## 2019-07-21 NOTE — ED Notes (Signed)
Patient transported to X-ray 

## 2019-07-21 NOTE — ED Provider Notes (Signed)
[facility] EMERGENCY DEPARTMENT Provider Note   CSN: [id number] Arrival date & time: 07/21/19  0515     History Chief Complaint  Patient presents with  . Emesis  . Asthma    Kristi Chambers is a 39 y.o. female.  Patient with history of spontaneous coronary artery dissection, asthma --presents to the emergency department with several complaints.  Patient's main complaint is a headache.  Patient went to bed at approximately 9:30 PM last night.  She had a mild headache at that time.  She woke about 2 AM with a more severe frontal headache.  She had an episode of vomiting.  She took Excedrin Migraine at home.  Her headache is currently improving.  She denies any associated neck pain, vision change, confusion, difficulty with balance or walking, fever.  Patient occasionally will get headaches but they are typically unilateral.  She denies head injury.    Patient also reports several days of worsening control of asthma.  She has been using albuterol without significant improvement.  Patient describes wheezing, chest tightness.  She states that her symptoms do not feel like the chest pain she experienced with her coronary artery dissection.  With that she also had numbness in extremities which is not currently present.  Patient denies fever or cough.  Patient denies diarrhea or abdominal pain.  No urinary symptoms.  No lower extremity swelling.          Past Medical History:  Diagnosis Date  . Asthma     Patient Active Problem List   Diagnosis Date Noted  . Dyslipidemia 05/05/2017  . ADD (attention deficit disorder) 05/05/2017  . History of asthma 05/05/2017  . Chest pain 05/03/2017  . STEMI (ST elevation myocardial infarction) (HCC)   . Coronary artery dissection     Past Surgical History:  Procedure Laterality Date  . LEFT HEART CATH AND CORONARY ANGIOGRAPHY N/A 05/03/2017   Procedure: LEFT HEART CATH AND CORONARY ANGIOGRAPHY;  Surgeon: Corky Crafts, MD;  Location: [facility] INVASIVE CV LAB;  Service: Cardiovascular;  Laterality: N/A;     OB History   No obstetric history on file.     Family History  Problem Relation Age of Onset  . High blood pressure Mother   . Heart disease Father   . High blood pressure Father   . Heart attack Father   . Stroke Father        cabg    Social History   Tobacco Use  . Smoking status: Former Smoker    Packs/day: 0.50  . Smokeless tobacco: Never Used  Substance Use Topics  . Alcohol use: Yes  . Drug use: No    Home Medications Prior to Admission medications   Medication Sig Start Date End Date Taking? Authorizing Provider  acetaminophen (TYLENOL) 325 MG tablet Take 2 tablets (650 mg total) by mouth every 4 (four) hours as needed for headache or mild pain. 05/05/17   Abelino Derrick, PA-C  albuterol (PROAIR HFA) 108 (90 Base) MCG/ACT inhaler Inhale 2 puffs into the lungs every 6 (six) hours as needed for wheezing or shortness of breath.    [provider]  aspirin EC 81 MG EC tablet Take 1 tablet (81 mg total) by mouth daily. 05/06/17   Abelino Derrick, PA-C  atorvastatin (LIPITOR) 40 MG tablet Take 40 mg by mouth daily. 12/10/17   [provider]  beclomethasone (QVAR) 80 MCG/ACT inhaler Inhale 2 puffs into the lungs 2 (two) times  daily.    [provider]  carvedilol (COREG) 3.125 MG tablet TAKE 1 TABLET BY MOUTH TWICE DAILY WITH MEALS 05/05/19   Kilroy, Eda Paschal, PA-C  cetirizine (ZYRTEC) 10 MG tablet Take 10 mg by mouth at bedtime.    [provider]  DULoxetine (CYMBALTA) 30 MG capsule Take 30 mg by mouth at bedtime. 06/05/18   [provider]  levonorgestrel (MIRENA) 20 MCG/24HR IUD 1 each by Intrauterine route once. Implanted October 2017    [provider]  lisinopril (PRINIVIL,ZESTRIL) 10 MG tablet Take 10 mg by mouth daily. 05/29/18   [provider]  nitroGLYCERIN (NITROSTAT) 0.4 MG SL tablet Place 1 tablet (0.4 mg total) under  the tongue every 5 (five) minutes x 3 doses as needed for chest pain. 05/05/17   Abelino Derrick, PA-C  omeprazole (PRILOSEC) 40 MG capsule Take 40 mg by mouth at bedtime.    [provider]    Allergies    Patient has no known allergies.  Review of Systems   Review of Systems  Constitutional: Negative for chills, fatigue and fever.  HENT: Negative for congestion, dental problem, ear pain, rhinorrhea, sinus pressure and sore throat.   Eyes: Negative for photophobia, discharge, redness and visual disturbance.  Respiratory: Positive for chest tightness and wheezing. Negative for cough and shortness of breath.   Cardiovascular: Negative for chest pain.  Gastrointestinal: Positive for nausea and vomiting. Negative for abdominal pain and diarrhea.  Genitourinary: Negative for dysuria.  Musculoskeletal: Negative for gait problem, myalgias, neck pain and neck stiffness.  Skin: Negative for rash.  Neurological: Positive for headaches. Negative for syncope, speech difficulty, weakness, light-headedness and numbness.  Hematological: Negative for adenopathy.  Psychiatric/Behavioral: Negative for confusion.    Physical Exam Updated Vital Signs BP (!) 149/89 (BP Location: Right Arm)   Pulse 93   Temp 97.9 F (36.6 C) (Oral)   Resp 16   LMP  (LMP Unknown)   SpO2 98%   Physical Exam Vitals and nursing note reviewed.  Constitutional:      Appearance: She is well-developed.  HENT:     Head: Normocephalic and atraumatic.     Right Ear: Tympanic membrane, ear canal and external ear normal.     Left Ear: Tympanic membrane, ear canal and external ear normal.     Nose: Nose normal. No congestion.     Mouth/Throat:     Pharynx: Uvula midline.     Comments: Normal dentition. Eyes:     General: Lids are normal.        Right eye: No discharge.        Left eye: No discharge.     Extraocular Movements:     Right eye: No nystagmus.     Left eye: No nystagmus.     Conjunctiva/sclera:  Conjunctivae normal.     Pupils: Pupils are equal, round, and reactive to light.  Neck:     Comments: No meningeal signs. Cardiovascular:     Rate and Rhythm: Normal rate and regular rhythm.     Heart sounds: Normal heart sounds.  Pulmonary:     Effort: Pulmonary effort is normal.     Breath sounds: Wheezing present.     Comments: Mild, scattered expiratory wheeze on exam, left lung greater than right. Abdominal:     Palpations: Abdomen is soft.     Tenderness: There is no abdominal tenderness. There is no guarding or rebound.  Musculoskeletal:     Cervical back: Normal range  of motion and neck supple. No rigidity, tenderness or bony tenderness.     Right lower leg: No edema.     Left lower leg: No edema.  Skin:    General: Skin is warm and dry.  Neurological:     Mental Status: She is alert and oriented to person, place, and time.     GCS: GCS eye subscore is 4. GCS verbal subscore is 5. GCS motor subscore is 6.     Cranial Nerves: No cranial nerve deficit.     Sensory: No sensory deficit.     Coordination: Coordination normal.     Gait: Gait normal.     Deep Tendon Reflexes: Reflexes are normal and symmetric.     ED Results / Procedures / Treatments   Labs (all labs ordered are listed, but only abnormal results are displayed) Labs Reviewed  COMPREHENSIVE METABOLIC PANEL - Abnormal; Notable for the following components:      Result Value   Glucose, Bld 108 (*)    Total Protein 6.4 (*)    All other components within normal limits  URINALYSIS, ROUTINE W REFLEX MICROSCOPIC - Abnormal; Notable for the following components:   APPearance CLOUDY (*)    Protein, ur 30 (*)    Leukocytes,Ua MODERATE (*)    All other components within normal limits  LIPASE, BLOOD  CBC  I-STAT BETA HCG BLOOD, ED ([facility], [facility], [facility] ONLY)    EKG EKG Interpretation  Date/Time:  Tuesday July 21 2019 09:01:28 EDT Ventricular Rate:  71 PR Interval:    QRS Duration: 90 QT Interval:  410 QTC  Calculation: 446 R Axis:   64 Text Interpretation: Sinus rhythm unremarkable ECG Confirmed by Gerhard Munch [phone]) on 07/21/2019 9:09:00 AM   Radiology DG Chest 2 View  Result Date: 07/21/2019 CLINICAL DATA:  Chest pain and shortness of breath EXAM: CHEST - 2 VIEW COMPARISON:  May 03, 2017 FINDINGS: Lungs are clear. Heart size and pulmonary vascularity are normal. No adenopathy. No pneumothorax. No bone lesions. IMPRESSION: No abnormality noted. Electronically Signed   By: Bretta Bang III M.D.   On: 07/21/2019 09:05    Procedures Procedures (including critical care time)  Medications Ordered in ED Medications  sodium chloride flush (NS) 0.9 % injection 3 mL (has no administration in time range)  predniSONE (DELTASONE) tablet 60 mg (has no administration in time range)    ED Course  I have reviewed the triage vital signs and the nursing notes.  Pertinent labs & imaging results that were available during my care of the patient were reviewed by me and considered in my medical decision making (see chart for details).  Patient seen and examined.  Patient had abdominal pain labs ordered on arrival to the emergency department.  These were reviewed.  At time of exam, she is intact neurologically and looks very well.  No respiratory distress or accessory muscle use.  Vital signs reviewed and are as follows: BP (!) 149/89 (BP Location: Right Arm)   Pulse 93   Temp 97.9 F (36.6 C) (Oral)   Resp 16   LMP  (LMP Unknown)   SpO2 98%   Regarding the patient's headache which is her primary presenting complaint: Patient with normal neurological exam.  No signs of meningismus.  No thunderclap headache.  Headache is somewhat different than typical, however improving and patient declines treatment for this here.  She has a reassuring exam.  We discussed head CT and agree to defer at this time less  symptoms were to change or worsen.  Regarding asthma symptoms: Patient will be started on  5-day burst of prednisone.  Will check chest x-ray.  She has some chest tightness and given her cardiac history, will check EKG.  She does not require any additional albuterol here in the emergency department and can be discharged home with continued albuterol and prednisone as well as PCP follow-up.  Patient does have some white blood cells noted on microscopy and urine however urine is not a clean-catch and she does not complain of any urinary symptoms at this time.  Doubt UTI.  9:20 AM CXR clear. Reviewed EKG.   9:45 AM patient updated on EKG and chest x-ray results.  I asked about her headache and she states that her head is feeling "good".  She does not require any additional medications.  Her exam is unchanged and she has a normal mental status.  Plan for discharge.  Encourage PCP follow-up for recheck as needed.  We discussed signs and symptoms to return. Patient counseled to return if they have recurrent or severe headache, confusion, vomiting, weakness in their arms or legs, slurred speech, trouble walking or talking, confusion, trouble with their balance, or if they have any other concerns. Patient verbalizes understanding and agrees with plan.      MDM Rules/Calculators/A&P                      Chest tightness/wheezing: Patient with history of asthma, symptoms similar to previous.  Minimal wheezing on exam.  She patient has not required any albuterol in the ED.  She is not hypoxic.  Chest x-ray is clear.  No signs of pneumonia or other infection.  Patient started on prednisone burst.  She will continue albuterol at home.  HA: Patient without high-risk features of headache including: sudden onset/thunderclap HA (started last night, worse this AM), no similar headache in past, altered mental status, accompanying seizure, headache with exertion, age > 74, history of immunocompromise, neck or shoulder pain, fever, use of anticoagulation, family history of spontaneous SAH, concomitant drug  use, toxic exposure.  Symptoms are improved after excedrin and patient has not required any additional medications in the ED.  Patient has a normal complete neurological exam, normal vital signs, normal level of consciousness, no signs of meningismus, is well-appearing/non-toxic appearing, no signs of trauma.   No dangerous or life-threatening conditions suspected or identified by history, physical exam, and by work-up. No indications for hospitalization identified.    Final Clinical Impression(s) / ED Diagnoses Final diagnoses:  Acute nonintractable headache, unspecified headache type  Exacerbation of asthma, unspecified asthma severity, unspecified whether persistent    Rx / DC Orders ED Discharge Orders         Ordered    predniSONE (DELTASONE) 20 MG tablet  Daily     07/21/19 0942           Carlisle Cater, PA-C 07/21/19 8469    Carmin Muskrat, MD 07/22/19 1113

## 2019-07-29 ENCOUNTER — Encounter: Payer: Self-pay | Admitting: Cardiovascular Disease

## 2019-07-29 ENCOUNTER — Ambulatory Visit (INDEPENDENT_AMBULATORY_CARE_PROVIDER_SITE_OTHER): Payer: Medicaid Other | Admitting: Cardiovascular Disease

## 2019-07-29 ENCOUNTER — Other Ambulatory Visit: Payer: Self-pay

## 2019-07-29 VITALS — BP 118/76 | HR 70 | Ht 67.0 in | Wt 200.0 lb

## 2019-07-29 DIAGNOSIS — I2510 Atherosclerotic heart disease of native coronary artery without angina pectoris: Secondary | ICD-10-CM

## 2019-07-29 DIAGNOSIS — R079 Chest pain, unspecified: Secondary | ICD-10-CM

## 2019-07-29 NOTE — Patient Instructions (Signed)
Medication Instructions:  NONE *If you need a refill on your cardiac medications before your next appointment, please call your pharmacy*   Lab Work: NONE If you have labs (blood work) drawn today and your tests are completely normal, you will receive your results only by: Marland Kitchen MyChart Message (if you have MyChart) OR . A paper copy in the mail If you have any lab test that is abnormal or we need to change your treatment, we will call you to review the results.   Testing/Procedures: NONE   Follow-Up: At [facility], you and your health needs are our priority.  As part of our continuing mission to provide you with exceptional heart care, we have created designated Provider Care Teams.  These Care Teams include your primary Cardiologist (physician) and Advanced Practice Providers (APPs -  Physician Assistants and Nurse Practitioners) who all work together to provide you with the care you need, when you need it.  We recommend signing up for the patient portal called "MyChart".  Sign up information is provided on this After Visit Summary.  MyChart is used to connect with patients for Virtual Visits (Telemedicine).  Patients are able to view lab/test results, encounter notes, upcoming appointments, etc.  Non-urgent messages can be sent to your provider as well.   To learn more about what you can do with MyChart, go to [email or web address].    Your next appointment:   1 year(s)  The format for your next appointment:   Either In Person or Virtual  Provider:   Dr Eden Emms   Other Instructions

## 2019-08-04 ENCOUNTER — Other Ambulatory Visit: Payer: Self-pay | Admitting: Cardiology

## 2019-10-06 ENCOUNTER — Other Ambulatory Visit: Payer: Self-pay | Admitting: Cardiovascular Disease

## 2020-03-29 ENCOUNTER — Other Ambulatory Visit (HOSPITAL_COMMUNITY): Payer: Self-pay

## 2020-03-31 ENCOUNTER — Other Ambulatory Visit: Payer: Self-pay | Admitting: Nurse Practitioner

## 2020-03-31 DIAGNOSIS — I213 ST elevation (STEMI) myocardial infarction of unspecified site: Secondary | ICD-10-CM

## 2020-03-31 DIAGNOSIS — I2542 Coronary artery dissection: Secondary | ICD-10-CM

## 2020-03-31 DIAGNOSIS — U071 COVID-19: Secondary | ICD-10-CM

## 2020-03-31 NOTE — Progress Notes (Signed)
I connected by phone with Kristi Chambers on 03/31/2020 at 9:25 AM to discuss the potential use of a new treatment for mild to moderate COVID-19 viral infection in non-hospitalized patients.  This patient is a 39 y.o. female that meets the FDA criteria for Emergency Use Authorization of COVID monoclonal antibody casirivimab/imdevimab, bamlanivimab/eteseviamb, or sotrovimab.  Has a (+) direct SARS-CoV-2 viral test result  Has mild or moderate COVID-19   Is NOT hospitalized due to COVID-19  Is within 10 days of symptom onset  Has at least one of the high risk factor(s) for progression to severe COVID-19 and/or hospitalization as defined in EUA.  Specific high risk criteria : BMI > 25 and Cardiovascular disease or hypertension   I have spoken and communicated the following to the patient or parent/caregiver regarding COVID monoclonal antibody treatment:  1. FDA has authorized the emergency use for the treatment of mild to moderate COVID-19 in adults and pediatric patients with positive results of direct SARS-CoV-2 viral testing who are 11 years of age and older weighing at least 40 kg, and who are at high risk for progressing to severe COVID-19 and/or hospitalization.  2. The significant known and potential risks and benefits of COVID monoclonal antibody, and the extent to which such potential risks and benefits are unknown.  3. Information on available alternative treatments and the risks and benefits of those alternatives, including clinical trials.  4. Patients treated with COVID monoclonal antibody should continue to self-isolate and use infection control measures (e.g., wear mask, isolate, social distance, avoid sharing personal items, clean and disinfect "high touch" surfaces, and frequent handwashing) according to CDC guidelines.   5. The patient or parent/caregiver has the option to accept or refuse COVID monoclonal antibody treatment.  After reviewing this information with the  patient, the patient has agreed to receive one of the available covid 19 monoclonal antibodies and will be provided an appropriate fact sheet prior to infusion. Mayra Reel, NP 03/31/2020 9:25 AM

## 2020-04-01 ENCOUNTER — Encounter: Payer: Self-pay | Admitting: Physician Assistant

## 2020-04-01 ENCOUNTER — Ambulatory Visit (HOSPITAL_COMMUNITY)
Admission: RE | Admit: 2020-04-01 | Discharge: 2020-04-01 | Disposition: A | Discharge status: Home / Self Care | Payer: Medicaid Other | Source: Ambulatory Visit | Attending: Pulmonary Disease | Admitting: Pulmonary Disease

## 2020-04-01 DIAGNOSIS — Z6825 Body mass index (BMI) 25.0-25.9, adult: Secondary | ICD-10-CM | POA: Insufficient documentation

## 2020-04-01 DIAGNOSIS — Z6830 Body mass index (BMI) 30.0-30.9, adult: Secondary | ICD-10-CM | POA: Insufficient documentation

## 2020-04-01 DIAGNOSIS — U071 COVID-19: Secondary | ICD-10-CM

## 2020-04-01 DIAGNOSIS — I2542 Coronary artery dissection: Secondary | ICD-10-CM | POA: Insufficient documentation

## 2020-04-01 DIAGNOSIS — I213 ST elevation (STEMI) myocardial infarction of unspecified site: Secondary | ICD-10-CM

## 2020-04-01 MED ORDER — ALBUTEROL SULFATE HFA 108 (90 BASE) MCG/ACT IN AERS: 2.0000 | INHALATION_SPRAY | Freq: Once | RESPIRATORY_TRACT | Status: DC | PRN

## 2020-04-01 MED ORDER — FAMOTIDINE IN NACL 20-0.9 MG/50ML-% IV SOLN: 20.0000 mg | Freq: Once | INTRAVENOUS | Status: DC | PRN

## 2020-04-01 MED ORDER — SODIUM CHLORIDE 0.9 % IV SOLN: INTRAVENOUS | Status: DC | PRN

## 2020-04-01 MED ORDER — EPINEPHRINE 0.3 MG/0.3ML IJ SOAJ: 0.3000 mg | Freq: Once | INTRAMUSCULAR | Status: DC | PRN

## 2020-04-01 MED ORDER — METHYLPREDNISOLONE SODIUM SUCC 125 MG IJ SOLR: 125.0000 mg | Freq: Once | INTRAMUSCULAR | Status: DC | PRN

## 2020-04-01 MED ORDER — SOTROVIMAB 500 MG/8ML IV SOLN
500.0000 mg | Freq: Once | INTRAVENOUS | Status: AC
  Administered 2020-04-01: 500 mg via INTRAVENOUS

## 2020-04-01 MED ORDER — DIPHENHYDRAMINE HCL 50 MG/ML IJ SOLN: 50.0000 mg | Freq: Once | INTRAMUSCULAR | Status: DC | PRN

## 2020-04-01 NOTE — Discharge Instructions (Signed)

## 2020-04-01 NOTE — Progress Notes (Signed)
Diagnosis: COVID-19  Physician: Dr. Patrick Wright  Procedure: [facility] Infusion [facility] Med: Sotrovimab infusion - Provided patient with sotrovimab fact sheet for patients, parents, and caregivers prior to infusion.   Complications: No immediate complications noted  Discharge: Discharged home    

## 2020-04-01 NOTE — Progress Notes (Signed)
Patient reviewed Fact Sheet for Patients, Parents, and Caregivers for Emergency Use Authorization (EUA) of Sotrovimab for the Treatment of Coronavirus. Patient also reviewed and is agreeable to the estimated cost of treatment. Patient is agreeable to proceed.   

## 2020-05-24 ENCOUNTER — Other Ambulatory Visit: Payer: Self-pay | Admitting: Cardiovascular Disease

## 2020-08-24 ENCOUNTER — Other Ambulatory Visit: Payer: Self-pay | Admitting: Cardiovascular Disease

## 2020-09-16 NOTE — Progress Notes (Signed)
CARDIOLOGY OFFICE NOTE  Date:  09/16/2020    Kristi Chambers Date of Birth: 1981/04/15 Medical Record #[id number]  PCP:  Mikael Spray, NP  Cardiologist:  Eden Emms    No chief complaint on file.   History of Present Illness:  40 y.o. history of HLD, asthma, ADD And CAD. 04/2017 had coronary dissection of mid LAD Rx medically EF 50-55% D/c o nASA, statin and  Aderall d/c Quit smoking Goes to [place] for relaxation Son is 16  At school in [place] and has 35 yo husbands child from another relationship Two dogs at home   She is working from home  Had COVID end of November 2021 and had Sotrovimab infusion   No chest pain   Past Medical History:  Diagnosis Date  . Asthma   . BMI 30.0-30.9,adult     Past Surgical History:  Procedure Laterality Date  . LEFT HEART CATH AND CORONARY ANGIOGRAPHY N/A 05/03/2017   Procedure: LEFT HEART CATH AND CORONARY ANGIOGRAPHY;  Surgeon: Corky Crafts, MD;  Location: [facility] INVASIVE CV LAB;  Service: Cardiovascular;  Laterality: N/A;     Medications: No outpatient medications have been marked as taking for the 09/23/20 encounter (Appointment) with Wendall Stade, MD.     Allergies: No Known Allergies  Social History: The patient  reports that she has quit smoking. She smoked 0.50 packs per day. She has never used smokeless tobacco. She reports current alcohol use. She reports that she does not use drugs.   Family History: The patient's family history includes Heart attack in her father; Heart disease in her father; High blood pressure in her father and mother; Stroke in her father.   Review of Systems: Please see the history of present illness.   Otherwise, the review of systems is positive for none.   All other systems are reviewed and negative.   Physical Exam: VS:  There were no vitals taken for this visit. Marland Kitchen  BMI There is no height or weight on file to calculate BMI.  Wt Readings from Last 3 Encounters:   07/29/19 90.7 kg  07/09/18 94.3 kg  12/23/17 90.6 kg   Affect appropriate Obese female  HEENT: normal Neck supple with no adenopathy JVP normal no bruits no thyromegaly Lungs clear with no wheezing and good diaphragmatic motion Heart:  S1/S2 no murmur, no rub, gallop or click PMI normal Abdomen: benighn, BS positve, no tenderness, no AAA no bruit.  No HSM or HJR Distal pulses intact with no bruits No edema Neuro non-focal Skin warm and dry No muscular weakness    LABORATORY DATA:  EKG:   09/23/2020 SR rate 68 normal ECG   Lab Results  Component Value Date   WBC 9.9 07/21/2019   HGB 13.5 07/21/2019   HCT 41.2 07/21/2019   PLT 303 07/21/2019   GLUCOSE 108 (H) 07/21/2019   CHOL 135 12/23/2017   TRIG 105 12/23/2017   HDL 42 12/23/2017   LDLCALC 72 12/23/2017   ALT 22 07/21/2019   AST 21 07/21/2019   NA 140 07/21/2019   K 3.8 07/21/2019   CL 103 07/21/2019   CREATININE 0.67 07/21/2019   BUN 11 07/21/2019   CO2 24 07/21/2019     BNP (last 3 results) No results for input(s): BNP in the last 8760 hours.  ProBNP (last 3 results) No results for input(s): PROBNP in the last 8760 hours.   Other Studies Reviewed Today:  Echo Study Conclusions 04/2017  -  Left ventricle: The cavity size was normal. Wall thickness was normal. Systolic function was normal. The estimated ejection fraction was in the range of 50% to 55%. Distal anteroapical and apical hypokinesis. Doppler parameters are consistent with abnormal left ventricular relaxation (grade 1 diastolic dysfunction). The E/e&' ratio is between 8-15, suggesting indeterminate LV filling pressure. - Left atrium: The atrium was normal in size. - Inferior vena cava: The vessel was normal in size. The respirophasic diameter changes were in the normal range (>= 50%), consistent with normal central venous pressure.  Impressions:  - LVEF 50-55%, normal wall thickness, distal anteroapical  and apical hypokinesis, grade 1 DD, indeterminate LV filling pressure, normal LA size, normal IVC.    LEFT HEART CATH AND CORONARY ANGIOGRAPHY 04/2017  Conclusion     Prox LAD lesion is 10% stenosed.  Mid LAD to Dist LAD lesion is 40% stenosed. 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 There is TIMI 3 flow.  There is no aortic valve stenosis.  The left ventricular systolic function is normal.  LV end diastolic pressure is mildly elevated.  The left ventricular ejection fraction is 50-55% by visual estimate.  Avoid anticoagulation. Continue aspirin 81 mg daily. Control BP aggressively. Watch in ICU.     Assessment/Plan:  1. Spontaneous coronary dissection of the mid to distal LAD with 40% stenosis and TIMI 3 flow in December of 2018 - no angina stable doing well   2. HLD - on statin therapy -  Needs updated labs with primary   3. Obesity - discussed low carb diet and exercise   4. Tobacco use - quit CXR 07/21/19 NAD   Current medicines are reviewed with the patient today.  The patient does not have concerns regarding medicines other than what has been noted above.  The following changes have been made:  See above.  Labs/ tests ordered today include: none will get with primary tomorrow    No orders of the defined types were placed in this encounter.    Disposition:   FU with me in a year    Signed: Charlton Haws, MD  09/16/2020 10:36 AM

## 2020-09-23 ENCOUNTER — Ambulatory Visit (INDEPENDENT_AMBULATORY_CARE_PROVIDER_SITE_OTHER): Payer: Medicaid Other | Admitting: Cardiovascular Disease

## 2020-09-23 ENCOUNTER — Encounter: Payer: Self-pay | Admitting: Cardiovascular Disease

## 2020-09-23 ENCOUNTER — Other Ambulatory Visit: Payer: Self-pay

## 2020-09-23 VITALS — BP 112/78 | HR 68 | Ht 67.0 in | Wt 209.2 lb

## 2020-09-23 DIAGNOSIS — I2102 ST elevation (STEMI) myocardial infarction involving left anterior descending coronary artery: Secondary | ICD-10-CM | POA: Diagnosis not present

## 2020-09-23 DIAGNOSIS — I2510 Atherosclerotic heart disease of native coronary artery without angina pectoris: Secondary | ICD-10-CM | POA: Diagnosis not present

## 2020-09-23 NOTE — Patient Instructions (Signed)
Medication Instructions:  °NO CHANGES °*If you need a refill on your cardiac medications before your next appointment, please call your pharmacy* ° ° °Lab Work: °NONE °If you have labs (blood work) drawn today and your tests are completely normal, you will receive your results only by: °MyChart Message (if you have MyChart) OR °A paper copy in the mail °If you have any lab test that is abnormal or we need to change your treatment, we will call you to review the results. ° ° °Testing/Procedures: °NONE ° ° °Follow-Up: °At [facility], you and your health needs are our priority.  As part of our continuing mission to provide you with exceptional heart care, we have created designated Provider Care Teams.  These Care Teams include your primary Cardiologist (physician) and Advanced Practice Providers (APPs -  Physician Assistants and Nurse Practitioners) who all work together to provide you with the care you need, when you need it. ° °We recommend signing up for the patient portal called "MyChart".  Sign up information is provided on this After Visit Summary.  MyChart is used to connect with patients for Virtual Visits (Telemedicine).  Patients are able to view lab/test results, encounter notes, upcoming appointments, etc.  Non-urgent messages can be sent to your provider as well.   °To learn more about what you can do with MyChart, go to [email or web address].   ° °Your next appointment:   °AS NEEDED ° ° °

## 2020-09-28 ENCOUNTER — Other Ambulatory Visit: Payer: Self-pay | Admitting: Cardiovascular Disease

## 2020-11-21 ENCOUNTER — Other Ambulatory Visit: Payer: Self-pay | Admitting: Cardiovascular Disease

## 2020-12-25 IMAGING — CR DG CHEST 2V
2 series · 2 of 2 positions shown · non-contrast
Comparison: May 03, 2017

CLINICAL DATA: Chest pain and shortness of breath

EXAM:
CHEST - 2 VIEW

[chest pa]
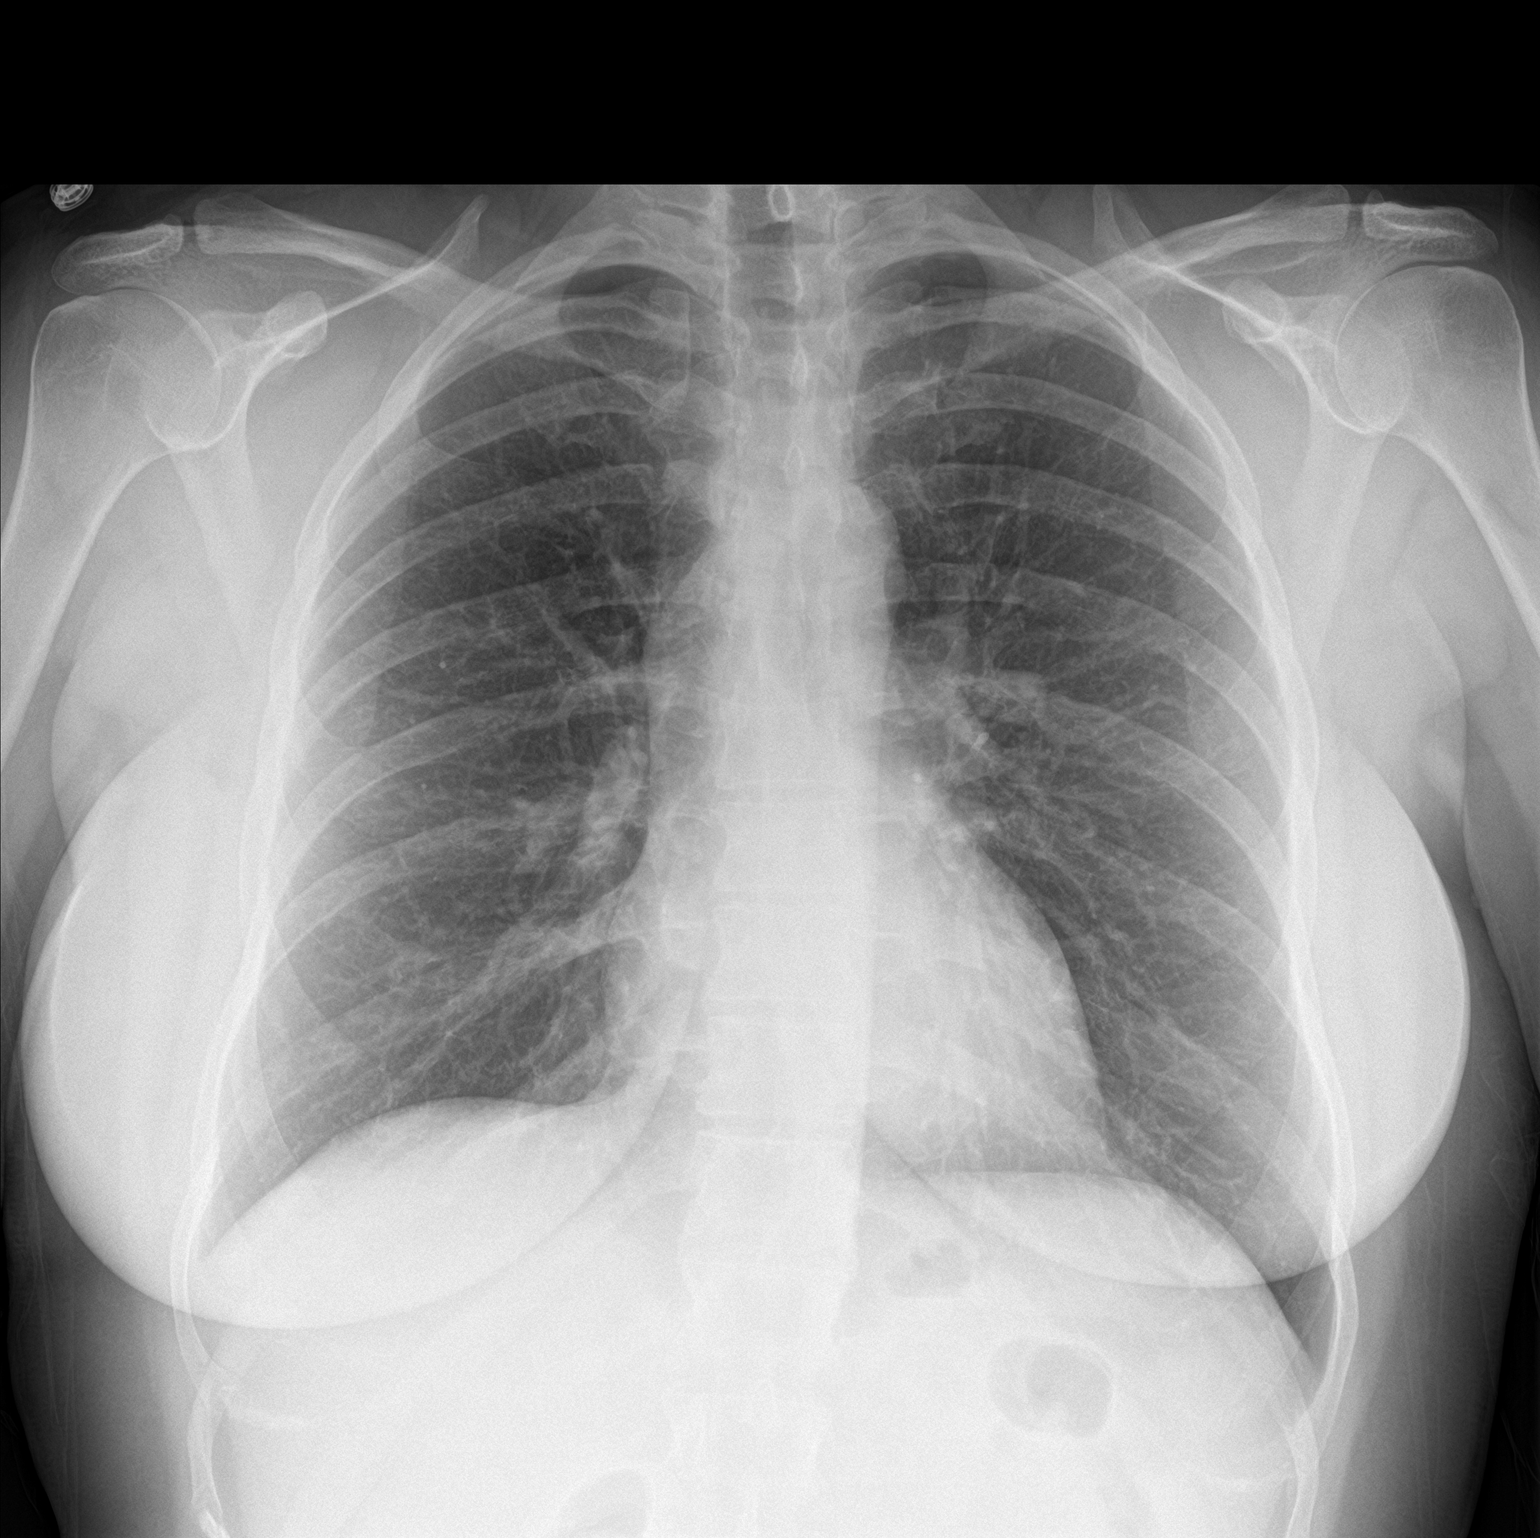

[chest lat]
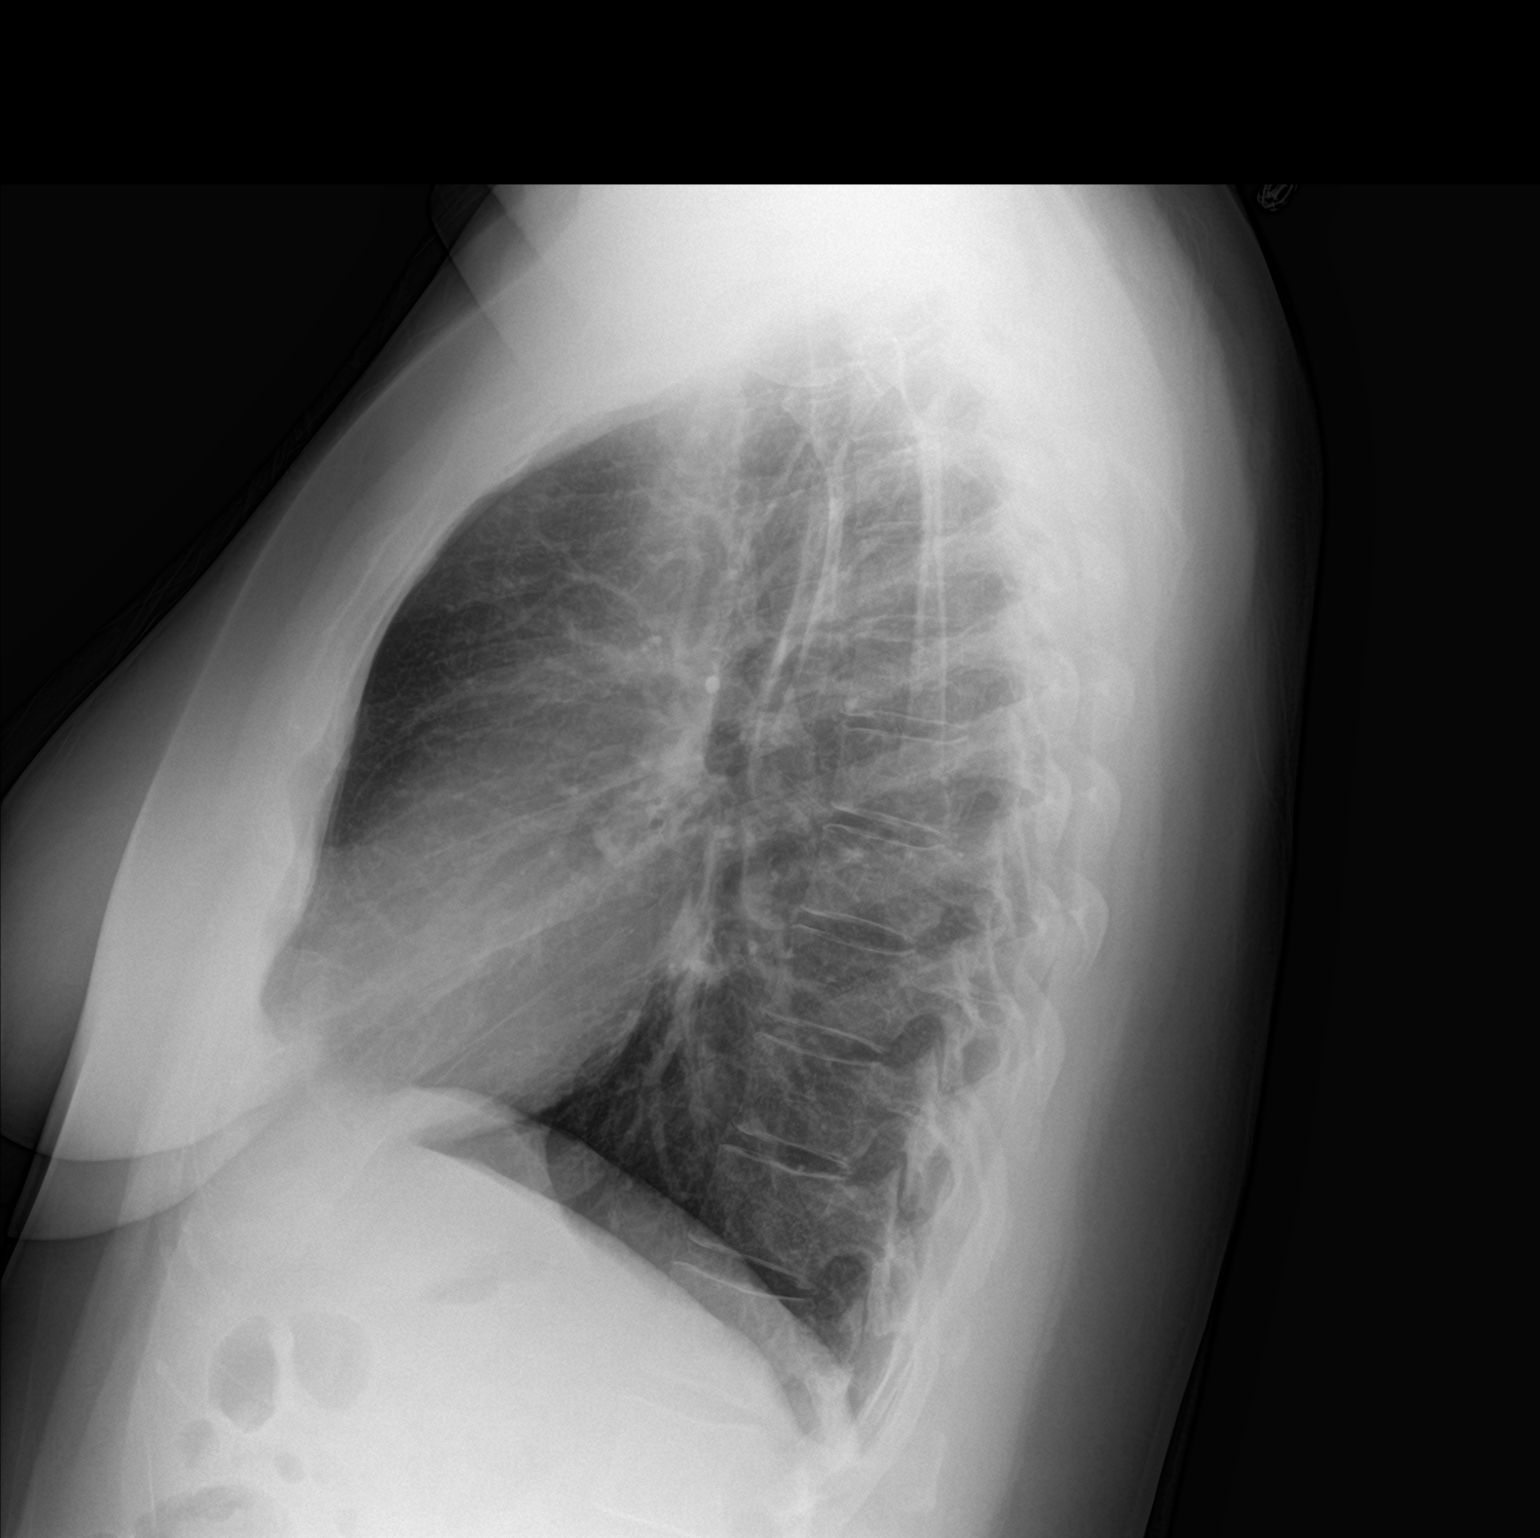

[2 of 2 positions shown; findings below may reference images not displayed]

FINDINGS: Lungs are clear. Heart size and pulmonary vascularity are normal. No
adenopathy. No pneumothorax. No bone lesions.
IMPRESSION: No abnormality noted.

## 2021-05-07 HISTORY — PX: SEPTOPLASTY: SUR1290

## 2021-05-12 NOTE — Progress Notes (Signed)
Office Visit Note  Patient: Kristi Chambers             Date of Birth: Jul 08, 1980           MRN: [id number]             PCP: Welford Roche, NP Referring: Darrol Jump, NP Visit Date: 05/23/2021 Occupation: @GUAROCC @  Subjective:  Positive ANA and joint stiffness  History of Present Illness: Kristi Chambers is a 41 y.o. female seen in consultation per request of her PCP.  According the patient for the last 2 years she has been experiencing excessive sleepiness.  She had a sleep study according to her which was negative.  She did some surfing on the Internet and was concerned about autoimmune disease.  She had asked her PCP to run some labs and her ANA was positive.  For that reason she is referred to me.  She denies any history of oral ulcers, nasal ulcers, malar rash, photosensitivity, Raynaud's phenomenon or lymphadenopathy.  There is no history of inflammatory arthritis.  She has morning stiffness which she describes in her legs and her feet.  She also was diagnosed with degenerative disease of lumbar spine.  There is no family history of autoimmune disease.  She is gravida 1, para 1, miscarriages 0.  There was no complications during the pregnancy.  There is no history of DVTs.  Activities of Daily Living:  Patient reports morning stiffness for 30 minutes.   Patient Denies nocturnal pain.  Difficulty dressing/grooming: Denies Difficulty climbing stairs: Denies Difficulty getting out of chair: Denies Difficulty using hands for taps, buttons, cutlery, and/or writing: Denies  Review of Systems  Constitutional:  Positive for fatigue.  HENT:  Negative for mouth sores, mouth dryness and nose dryness.   Eyes:  Negative for pain, itching and dryness.  Respiratory:  Positive for shortness of breath. Negative for difficulty breathing.        Asthma  Cardiovascular:  Negative for chest pain and palpitations.  Gastrointestinal:  Negative for blood in stool, constipation and diarrhea.   Endocrine: Negative for increased urination.  Genitourinary:  Negative for difficulty urinating.  Musculoskeletal:  Positive for morning stiffness. Negative for joint pain, joint pain, joint swelling, myalgias, muscle tenderness and myalgias.  Skin:  Negative for color change, rash, redness and sensitivity to sunlight.  Allergic/Immunologic: Negative for susceptible to infections.  Neurological:  Positive for numbness. Negative for dizziness, headaches, memory loss and weakness.  Hematological:  Positive for bruising/bleeding tendency. Negative for swollen glands.  Psychiatric/Behavioral:  Positive for depressed mood and sleep disturbance. Negative for confusion. The patient is nervous/anxious.    PMFS History:  Patient Active Problem List   Diagnosis Date Noted   BMI 30.0-30.9,adult    Dyslipidemia 05/05/2017   ADD (attention deficit disorder) 05/05/2017   History of asthma 05/05/2017   Chest pain 05/03/2017   STEMI (ST elevation myocardial infarction) [facility])    Coronary artery dissection     Past Medical History:  Diagnosis Date   Asthma    BMI 30.0-30.9,adult     Family History  Problem Relation Age of Onset   High blood pressure Mother    Heart disease Father    High blood pressure Father    Heart attack Father    Stroke Father        cabg   Healthy Son    Past Surgical History:  Procedure Laterality Date   LEFT HEART CATH AND CORONARY ANGIOGRAPHY N/A 05/03/2017  Procedure: LEFT HEART CATH AND CORONARY ANGIOGRAPHY;  Surgeon: Jettie Booze, MD;  Location: [place] [facility];  Service: Cardiovascular;  Laterality: N/A;   Social History   Social History Narrative   Not on file   There is no immunization history for the selected administration types on file for this patient.   Objective: Vital Signs: BP (!) 142/89 (BP Location: Right Arm, Patient Position: Sitting, Cuff Size: Normal)    Pulse 65    Ht 5\' 8"  (1.727 m)    Wt 221 lb 3.2 oz (100.3 kg)    BMI 33.63  kg/m    Physical Exam Vitals and nursing note reviewed.  Constitutional:      Appearance: She is well-developed.  HENT:     Head: Normocephalic and atraumatic.  Eyes:     Conjunctiva/sclera: Conjunctivae normal.  Cardiovascular:     Rate and Rhythm: Normal rate and regular rhythm.     Heart sounds: Normal heart sounds.  Pulmonary:     Effort: Pulmonary effort is normal.     Breath sounds: Normal breath sounds.  Abdominal:     General: Bowel sounds are normal.     Palpations: Abdomen is soft.  Musculoskeletal:     Cervical back: Normal range of motion.  Lymphadenopathy:     Cervical: No cervical adenopathy.  Skin:    General: Skin is warm and dry.     Capillary Refill: Capillary refill takes less than 2 seconds.  Neurological:     Mental Status: She is alert and oriented to person, place, and time.  Psychiatric:        Behavior: Behavior normal.     Musculoskeletal Exam: C-spine thoracic and lumbar spine with good range of motion.  Shoulder joints, elbow joints, wrist joints, MCPs PIPs and DIPs with good range of motion with no synovitis.  Hip joints, knee joints, ankles, MTPs and PIPs with good range of motion with no synovitis.  CDAI Exam: CDAI Score: -- Patient Global: --; Provider Global: -- Swollen: --; Tender: -- Joint Exam 05/23/2021   No joint exam has been documented for this visit   There is currently no information documented on the homunculus. Go to the Rheumatology activity and complete the homunculus joint exam.  Investigation: No additional findings.  Imaging: No results found.  Recent Labs: Lab Results  Component Value Date   WBC 9.9 07/21/2019   HGB 13.5 07/21/2019   PLT 303 07/21/2019   NA 140 07/21/2019   K 3.8 07/21/2019   CL 103 07/21/2019   CO2 24 07/21/2019   GLUCOSE 108 (H) 07/21/2019   BUN 11 07/21/2019   CREATININE 0.67 07/21/2019   BILITOT 0.7 07/21/2019   ALKPHOS 64 07/21/2019   AST 21 07/21/2019   ALT 22 07/21/2019   PROT  6.4 (L) 07/21/2019   ALBUMIN 3.9 07/21/2019   CALCIUM 8.9 07/21/2019   GFRAA >60 07/21/2019    Speciality Comments: No specialty comments available.  Procedures:  No procedures performed Allergies: Patient has no known allergies.   Assessment / Plan:     Visit Diagnoses: Positive ANA (antinuclear antibody) - 02/28/21: ANA 1:320 Nuclear, dense fine speckled, vitamin B12 518, folate 8.9, vitamin D is 38.  Patient has positive ANA.  There is no history of oral ulcers, nasal ulcers, malar rash, photosensitivity, Raynaud's phenomenon, inflammatory arthritis or lymphadenopathy.  She gives history of some stiffness in her feet and her lower extremities.  No joint swelling was noted.  I will obtain AVISE  labs.  Other fatigue-she gives history of chronic fatigue.  Hypersomnia-she gives history of hypersomnolence.  Patient states that she had sleep study which was negative for sleep apnea.  According the patient she sleeps through the night but it is not restful sleep.  DDD (degenerative disc disease), lumbar-patient states she was diagnosed with degenerative disc disease based on the x-rays.  She has some chronic back discomfort.  Dyslipidemia-she is on atorvastatin.  History of ST elevation myocardial infarction (STEMI)-in 2018.  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  She is followed by cardiologist.  History of asthma-she uses inhaler on as needed basis.  History of gastroesophageal reflux (GERD)  Anxiety and depression-she is on Cymbalta.  Former smoker - 1PPD x 10 years. Quit in 2013.  Her boyfriend still smokes.  Chest x-ray was normal in 2021.  Orders: No orders of the defined types were placed in this encounter.  No orders of the defined types were placed in this encounter.    Follow-Up Instructions: Return for Positive ANA.   Bo Merino, MD  Note - This record has been created using Editor, commissioning.  Chart creation  errors have been sought, but may not always  have been located. Such creation errors do not reflect on  the standard of medical care.

## 2021-05-23 ENCOUNTER — Ambulatory Visit: Payer: Medicaid Other | Admitting: Rheumatology

## 2021-05-23 ENCOUNTER — Other Ambulatory Visit: Payer: Self-pay

## 2021-05-23 ENCOUNTER — Encounter: Payer: Self-pay | Admitting: Rheumatology

## 2021-05-23 VITALS — BP 142/89 | HR 65 | Ht 68.0 in | Wt 221.2 lb

## 2021-05-23 DIAGNOSIS — G4710 Hypersomnia, unspecified: Secondary | ICD-10-CM

## 2021-05-23 DIAGNOSIS — I2542 Coronary artery dissection: Secondary | ICD-10-CM

## 2021-05-23 DIAGNOSIS — Z87891 Personal history of nicotine dependence: Secondary | ICD-10-CM

## 2021-05-23 DIAGNOSIS — Z8719 Personal history of other diseases of the digestive system: Secondary | ICD-10-CM

## 2021-05-23 DIAGNOSIS — R5383 Other fatigue: Secondary | ICD-10-CM | POA: Diagnosis not present

## 2021-05-23 DIAGNOSIS — Z8709 Personal history of other diseases of the respiratory system: Secondary | ICD-10-CM

## 2021-05-23 DIAGNOSIS — F419 Anxiety disorder, unspecified: Secondary | ICD-10-CM

## 2021-05-23 DIAGNOSIS — F32A Depression, unspecified: Secondary | ICD-10-CM

## 2021-05-23 DIAGNOSIS — I252 Old myocardial infarction: Secondary | ICD-10-CM

## 2021-05-23 DIAGNOSIS — M5136 Other intervertebral disc degeneration, lumbar region: Secondary | ICD-10-CM

## 2021-05-23 DIAGNOSIS — R768 Other specified abnormal immunological findings in serum: Secondary | ICD-10-CM | POA: Diagnosis not present

## 2021-05-23 DIAGNOSIS — E785 Hyperlipidemia, unspecified: Secondary | ICD-10-CM

## 2021-05-24 ENCOUNTER — Telehealth: Payer: Self-pay

## 2021-05-24 NOTE — Telephone Encounter (Signed)
I spoke with Kristi Chambers, the [facility] rep and he will contact [facility] and [facility] labs in regards to this situation. Advised [name] to call our office back with outcome and if patient needs to have labs redrawn.

## 2021-05-24 NOTE — Telephone Encounter (Signed)
[facility] left a voicemail stating they received AV[facility] testing sample for the patient that was ordered by Dr. Estanislado Pandy.   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  Please call back and let us know if there is somewhere you want it shipped or if we should discard the sample.   Phone [phone]

## 2021-05-25 NOTE — Telephone Encounter (Signed)
Unfortunately, patient will have to have labs re-drawn. I have called patient and explained the packing error and apologized to the patient. I have prepared a new order and these labs are to be re-drawn at NO COST to the patient. I have placed the new order at the front desk for pick up. Patient verbalized understanding.

## 2021-05-26 ENCOUNTER — Encounter: Payer: Self-pay | Admitting: Rheumatology

## 2021-06-02 NOTE — Progress Notes (Signed)
Office Visit Note  Patient: Kristi Chambers             Date of Birth: 18-Nov-1980           MRN: [id number]             PCP: Welford Roche, NP Referring: Welford Roche, NP Visit Date: 06/13/2021 Occupation: @GUAROCC @  Subjective:  Discuss AVISE  History of Present Illness: Kristi Chambers is a 41 y.o. female with history of positive ANA. She presents today to discuss AVISE lab results from 05/26/21.  She reports that since her initial office visit she continues to have significant fatigue and daytime drowsiness.  In the past she has undergone 2 sleep studies.  She states that she does not wake up feeling restored and after work feels exhausted.  She denies any other new or worsening symptoms since her initial office visit.  She has not experienced any joint pain or joint swelling at this time.  She denies any swollen lymph nodes or fevers.  She has not had any symptoms of Raynaud's, hair loss, or facial rashes.  She denies any oral or nasal ulcerations.  She has not had any sicca symptoms.  She denies any shortness of breath, pleuritic chest pain, or palpitations.   Activities of Daily Living:  Patient reports morning stiffness for 30 minutes.   Patient Denies nocturnal pain.  Difficulty dressing/grooming: Denies Difficulty climbing stairs: Denies Difficulty getting out of chair: Denies Difficulty using hands for taps, buttons, cutlery, and/or writing: Denies  Review of Systems  Constitutional:  Positive for fatigue.  HENT:  Negative for mouth sores, mouth dryness and nose dryness.   Eyes:  Negative for pain, itching and dryness.  Respiratory:  Negative for shortness of breath and difficulty breathing.   Cardiovascular:  Negative for chest pain and palpitations.  Gastrointestinal:  Negative for blood in stool, constipation and diarrhea.  Endocrine: Negative for increased urination.  Genitourinary:  Negative for difficulty urinating.  Musculoskeletal:  Positive for morning  stiffness. Negative for joint pain, joint pain, joint swelling, myalgias, muscle tenderness and myalgias.  Skin:  Negative for color change, rash and redness.  Allergic/Immunologic: Negative for susceptible to infections.  Neurological:  Negative for dizziness, numbness, headaches, memory loss and weakness.  Hematological:  Positive for bruising/bleeding tendency.  Psychiatric/Behavioral:  Negative for confusion.    PMFS History:  Patient Active Problem List   Diagnosis Date Noted   BMI 30.0-30.9,adult    Dyslipidemia 05/05/2017   ADD (attention deficit disorder) 05/05/2017   History of asthma 05/05/2017   Chest pain 05/03/2017   STEMI (ST elevation myocardial infarction) [facility])    Coronary artery dissection     Past Medical History:  Diagnosis Date   Asthma    BMI 30.0-30.9,adult     Family History  Problem Relation Age of Onset   High blood pressure Mother    Heart disease Father    High blood pressure Father    Heart attack Father    Stroke Father        cabg   Healthy Son    Past Surgical History:  Procedure Laterality Date   LEFT HEART CATH AND CORONARY ANGIOGRAPHY N/A 05/03/2017   Procedure: LEFT HEART CATH AND CORONARY ANGIOGRAPHY;  Surgeon: Jettie Booze, MD;  Location: [place] [facility];  Service: Cardiovascular;  Laterality: N/A;   SEPTOPLASTY  05/2021   Social History   Social History Narrative   Not on file   There is  no immunization history for the selected administration types on file for this patient.   Objective: Vital Signs: BP 121/75 (BP Location: Left Arm, Patient Position: Sitting, Cuff Size: Large)    Pulse 76    Ht 5' 7"  (1.702 m)    Wt 219 lb (99.3 kg)    BMI 34.30 kg/m    Physical Exam Vitals and nursing note reviewed.  Constitutional:      Appearance: She is well-developed.  HENT:     Head: Normocephalic and atraumatic.  Eyes:     Conjunctiva/sclera: Conjunctivae normal.  Cardiovascular:     Rate and Rhythm: Normal rate and  regular rhythm.     Heart sounds: Normal heart sounds.  Pulmonary:     Effort: Pulmonary effort is normal.     Breath sounds: Normal breath sounds.  Abdominal:     General: Bowel sounds are normal.     Palpations: Abdomen is soft.  Musculoskeletal:     Cervical back: Normal range of motion.  Lymphadenopathy:     Cervical: No cervical adenopathy.  Skin:    General: Skin is warm and dry.     Capillary Refill: Capillary refill takes less than 2 seconds.  Neurological:     Mental Status: She is alert and oriented to person, place, and time.  Psychiatric:        Behavior: Behavior normal.     Musculoskeletal Exam: C-spine, thoracic spine, and lumbar spine have good range of motion with no discomfort.  Shoulder joints, elbow joints, wrist joints, MCPs, PIPs, DIPs have good range of motion with no synovitis.  Complete fist formation bilaterally.  Hip joints, knee joints, and ankle joints have good range of motion with no discomfort.  No warmth or effusion of knee joints noted.  No tenderness or swelling of ankle joints.  CDAI Exam: CDAI Score: -- Patient Global: --; Provider Global: -- Swollen: --; Tender: -- Joint Exam 06/13/2021   No joint exam has been documented for this visit   There is currently no information documented on the homunculus. Go to the Rheumatology activity and complete the homunculus joint exam.  Investigation: No additional findings.  Imaging: No results found.  Recent Labs: Lab Results  Component Value Date   WBC 9.9 07/21/2019   HGB 13.5 07/21/2019   PLT 303 07/21/2019   NA 140 07/21/2019   K 3.8 07/21/2019   CL 103 07/21/2019   CO2 24 07/21/2019   GLUCOSE 108 (H) 07/21/2019   BUN 11 07/21/2019   CREATININE 0.67 07/21/2019   BILITOT 0.7 07/21/2019   ALKPHOS 64 07/21/2019   AST 21 07/21/2019   ALT 22 07/21/2019   PROT 6.4 (L) 07/21/2019   ALBUMIN 3.9 07/21/2019   CALCIUM 8.9 07/21/2019   GFRAA >60 07/21/2019    02/28/21: ANA 1:320 Nuclear,  dense fine speckled, vitamin B12 518, folate 8.9, vitamin D is 38.  Speciality Comments: No specialty comments available.  Procedures:  No procedures performed Allergies: Patient has no known allergies.   Assessment / Plan:     Visit Diagnoses: Positive ANA (antinuclear antibody) - 02/28/21: ANA 1:320 Nuclear, dense fine speckled, vitamin B12 518, folate 8.9, vitamin D is 38.  [facility] results from 05/26/21 were reviewed with the patient today: Index is negative -1.6.  ANA 1:160 nuclear, dense fine speckled.  Anti-phosphatidylserine IgG positive. Results were discussed with the patient today in the office.  All questions were addressed.  Discussed that she does not currently meet clinical criteria for systemic lupus.  She does not have any clinical features of SLE at this time.  She has ongoing fatigue on a daily basis but has not developed any new or worsening symptoms.  To confirm that the phosphatidylserine IgG is a true positive the test will need to be rechecked in 3 months.  Lupus anticoagulant will also be drawn at that time along with repeat ANA, complements, and ESR.  Discussed possible signs and symptoms of systemic lupus to monitor for closely.  She was advised to notify us if she develops any new or worsening symptoms prior to her follow-up visit in 3 months.  She was encouraged to have lab work drawn prior to her appointment so we can discuss lab results at that time.  Futures were placed today. - Plan: ANA, C3 and C4, Sedimentation rate, Lupus Anticoagulant Eval w/Reflex, Phosphatidylserine/Prothrombin (PS/PT) Antibodies (IgG, IgM)  DDD (degenerative disc disease), lumbar: She is not experiencing any increased discomfort in her lower back at this time.  No symptoms of radiculopathy.  Other fatigue: She continues to experience fatigue on a daily basis.  She has undergone a thorough work-up by her PCP.  She has also undergone 2 sleep studies.  She does not wake up feeling restored in the  mornings and has significant fatigue after working.  Discussed the importance of regular exercise and good sleep hygiene.  Hypersomnia  Other medical conditions are listed as follows:  Dyslipidemia - on atorvastatin  Coronary artery dissection - spontaneous coronary dissection of mid LAD 2018.   History of ST elevation myocardial infarction (STEMI) - 2018  History of asthma  History of gastroesophageal reflux (GERD)  Anxiety and depression - on Cymbalta  Former smoker - 1PPD x 10 years. Quit in 2013.  Her boyfriend still smokes.  Chest x-ray was normal in 2021.  Orders: Orders Placed This Encounter  Procedures   ANA   C3 and C4   Sedimentation rate   Lupus Anticoagulant Eval w/Reflex   Phosphatidylserine/Prothrombin (PS/PT) Antibodies (IgG, IgM)   No orders of the defined types were placed in this encounter.    Follow-Up Instructions: Return in about 3 months (around 09/10/2021) for +ANA.   Ofilia Neas, PA-C  Note - This record has been created using Dragon software.  Chart creation errors have been sought, but may not always  have been located. Such creation errors do not reflect on  the standard of medical care.

## 2021-06-13 ENCOUNTER — Encounter: Payer: Self-pay | Admitting: Physician Assistant

## 2021-06-13 ENCOUNTER — Other Ambulatory Visit: Payer: Self-pay

## 2021-06-13 ENCOUNTER — Ambulatory Visit (INDEPENDENT_AMBULATORY_CARE_PROVIDER_SITE_OTHER): Payer: Medicaid Other | Admitting: Physician Assistant

## 2021-06-13 VITALS — BP 121/75 | HR 76 | Ht 67.0 in | Wt 219.0 lb

## 2021-06-13 DIAGNOSIS — G4710 Hypersomnia, unspecified: Secondary | ICD-10-CM | POA: Diagnosis not present

## 2021-06-13 DIAGNOSIS — Z8709 Personal history of other diseases of the respiratory system: Secondary | ICD-10-CM

## 2021-06-13 DIAGNOSIS — R5383 Other fatigue: Secondary | ICD-10-CM

## 2021-06-13 DIAGNOSIS — E785 Hyperlipidemia, unspecified: Secondary | ICD-10-CM

## 2021-06-13 DIAGNOSIS — R768 Other specified abnormal immunological findings in serum: Secondary | ICD-10-CM | POA: Diagnosis not present

## 2021-06-13 DIAGNOSIS — M5136 Other intervertebral disc degeneration, lumbar region: Secondary | ICD-10-CM | POA: Diagnosis not present

## 2021-06-13 DIAGNOSIS — F419 Anxiety disorder, unspecified: Secondary | ICD-10-CM

## 2021-06-13 DIAGNOSIS — I2542 Coronary artery dissection: Secondary | ICD-10-CM

## 2021-06-13 DIAGNOSIS — Z8719 Personal history of other diseases of the digestive system: Secondary | ICD-10-CM

## 2021-06-13 DIAGNOSIS — Z87891 Personal history of nicotine dependence: Secondary | ICD-10-CM

## 2021-06-13 DIAGNOSIS — I252 Old myocardial infarction: Secondary | ICD-10-CM

## 2021-06-13 DIAGNOSIS — F32A Depression, unspecified: Secondary | ICD-10-CM

## 2021-06-13 NOTE — Patient Instructions (Signed)
Standing Labs We placed an order today for your standing lab work.   Please have your standing labs drawn in 3 months   If possible, please have your labs drawn 2 weeks prior to your appointment so that the provider can discuss your results at your appointment.  Please note that you may see your imaging and lab results in MyChart before we have reviewed them. We may be awaiting multiple results to interpret others before contacting you. Please allow our office up to 72 hours to thoroughly review all of the results before contacting the office for clarification of your results.  We have open lab daily: Monday through Thursday from 1:30-4:30 PM and Friday from 1:30-4:00 PM at the office of Dr. Shaili Deveshwar, [facility].   Please be advised, all patients with office appointments requiring lab work will take precedent over walk-in lab work.  If possible, please come for your lab work on Monday and Friday afternoons, as you may experience shorter wait times. The office is located at [address], [address], [place], [place] [address] No appointment is necessary.   Labs are drawn by [facility]. Please bring your co-pay at the time of your lab draw.  You may receive a bill from [facility] for your lab work.  Please note if you are on Hydroxychloroquine and and an order has been placed for a Hydroxychloroquine level, you will need to have it drawn 4 hours or more after your last dose.  If you wish to have your labs drawn at another location, please call the office 24 hours in advance to send orders.  If you have any questions regarding directions or hours of operation,  please call [phone].   As a reminder, please drink plenty of water prior to coming for your lab work. Thanks!  

## 2021-08-28 NOTE — Progress Notes (Signed)
? ?Office Visit Note ? ?Patient: Kristi Chambers             ?Date of Birth: [birth year]           ?MRN: [id number]             ?PCP: Welford Roche, NP ?Referring: Welford Roche, NP ?Visit Date: 09/11/2021 ?Occupation: @GUAROCC @ ? ?Subjective:  ?Discuss lab results  ? ?History of Present Illness: Kristi Chambers is a 41 y.o. female with history of positive ANA and fatigue.  Patient presents today to discuss recent lab results drawn on 08/31/2021.  She denies any new or worsening symptoms since her last office visit on 06/13/2021.  She continues to have persistent fatigue on a daily basis.  She has undergone a thorough work-up by her PCP including 2 sleep studies which were normal.  She states that if she is not active she will fall asleep in 5 to 10 minutes. ?She denies any new or worsening symptoms consistent with autoimmune disease.  She has not had any oral or nasal ulcerations, sicca symptoms, Raynaud's phenomenon, alopecia, facial rashes, photosensitivity, joint pain, joint swelling, pleuritic chest pain, or shortness of breath. ? ? ?Activities of Daily Living:  ?Patient reports morning stiffness for 30-45 minutes.   ?Patient Denies nocturnal pain.  ?Difficulty dressing/grooming: Denies ?Difficulty climbing stairs: Denies ?Difficulty getting out of chair: Denies ?Difficulty using hands for taps, buttons, cutlery, and/or writing: Denies ? ?Review of Systems  ?Constitutional:  Positive for fatigue.  ?HENT:  Negative for mouth sores, mouth dryness and nose dryness.   ?Eyes:  Negative for pain, itching and dryness.  ?Respiratory:  Negative for shortness of breath and difficulty breathing.   ?Cardiovascular:  Negative for chest pain and palpitations.  ?Gastrointestinal:  Negative for blood in stool, constipation and diarrhea.  ?Endocrine: Negative for increased urination.  ?Genitourinary:  Negative for difficulty urinating.  ?Musculoskeletal:  Positive for morning stiffness. Negative for joint pain, joint pain, joint  swelling, myalgias, muscle tenderness and myalgias.  ?Skin:  Negative for color change, rash and redness.  ?Allergic/Immunologic: Negative for susceptible to infections.  ?Neurological:  Negative for dizziness, numbness, headaches, memory loss and weakness.  ?Hematological:  Negative for bruising/bleeding tendency.  ?Psychiatric/Behavioral:  Negative for confusion.   ? ?PMFS History:  ?Patient Active Problem List  ? Diagnosis Date Noted  ? BMI 30.0-30.9,adult   ? Dyslipidemia 05/05/2017  ? ADD (attention deficit disorder) 05/05/2017  ? History of asthma 05/05/2017  ? Chest pain 05/03/2017  ? STEMI (ST elevation myocardial infarction) ([place])   ? Coronary artery dissection   ?  ?Past Medical History:  ?Diagnosis Date  ? Asthma   ? BMI 30.0-30.9,adult   ?  ?Family History  ?Problem Relation Age of Onset  ? High blood pressure Mother   ? Heart disease Father   ? High blood pressure Father   ? Heart attack Father   ? Stroke Father   ?     cabg  ? Healthy Son   ? ?Past Surgical History:  ?Procedure Laterality Date  ? LEFT HEART CATH AND CORONARY ANGIOGRAPHY N/A 05/03/2017  ? Procedure: LEFT HEART CATH AND CORONARY ANGIOGRAPHY;  Surgeon: Jettie Booze, MD;  Location: [facility];  Service: Cardiovascular;  Laterality: N/A;  ? SEPTOPLASTY  05/2021  ? ?Social History  ? ?Social History Narrative  ? Not on file  ? ?There is no immunization history for the selected administration types on file for this patient.  ? ?Objective: ?  Vital Signs: BP 128/84 (BP Location: Left Arm, Patient Position: Sitting, Cuff Size: Normal)   Pulse 67   Ht 5' 7"  (1.702 m)   Wt 217 lb 12.8 oz (98.8 kg)   BMI 34.11 kg/m?   ? ?Physical Exam ?Vitals and nursing note reviewed.  ?Constitutional:   ?   Appearance: She is well-developed.  ?HENT:  ?   Head: Normocephalic and atraumatic.  ?Eyes:  ?   Conjunctiva/sclera: Conjunctivae normal.  ?Cardiovascular:  ?   Rate and Rhythm: Normal rate and regular rhythm.  ?   Heart sounds: Normal heart  sounds.  ?Pulmonary:  ?   Effort: Pulmonary effort is normal.  ?   Breath sounds: Normal breath sounds.  ?Abdominal:  ?   General: Bowel sounds are normal.  ?   Palpations: Abdomen is soft.  ?Musculoskeletal:  ?   Cervical back: Normal range of motion.  ?Skin: ?   General: Skin is warm and dry.  ?   Capillary Refill: Capillary refill takes less than 2 seconds.  ?Neurological:  ?   Mental Status: She is alert and oriented to person, place, and time.  ?Psychiatric:     ?   Behavior: Behavior normal.  ?  ? ?Musculoskeletal Exam: C-spine, thoracic spine, and lumbar spine have good range of motion with no discomfort.  Shoulder joints, elbow joints, wrist joints, MCPs, PIPs, DIPs have good range of motion with no synovitis.  Complete fist formation bilaterally.  Hip joints have good range of motion with no groin pain.  Knee joints have good range of motion with no warmth or effusion.  Ankle joints have good range of motion with no tenderness or joint swelling. ? ?CDAI Exam: ?CDAI Score: -- ?Patient Global: --; Provider Global: -- ?Swollen: --; Tender: -- ?Joint Exam 09/11/2021  ? ?No joint exam has been documented for this visit  ? ?There is currently no information documented on the homunculus. Go to the Rheumatology activity and complete the homunculus joint exam. ? ?Investigation: ?No additional findings. ? ?Imaging: ?No results found. ? ?Recent Labs: ?Lab Results  ?Component Value Date  ? WBC 9.9 07/21/2019  ? HGB 13.5 07/21/2019  ? PLT 303 07/21/2019  ? NA 140 07/21/2019  ? K 3.8 07/21/2019  ? CL 103 07/21/2019  ? CO2 24 07/21/2019  ? GLUCOSE 108 (H) 07/21/2019  ? BUN 11 07/21/2019  ? CREATININE 0.67 07/21/2019  ? BILITOT 0.7 07/21/2019  ? ALKPHOS 64 07/21/2019  ? AST 21 07/21/2019  ? ALT 22 07/21/2019  ? PROT 6.4 (L) 07/21/2019  ? ALBUMIN 3.9 07/21/2019  ? CALCIUM 8.9 07/21/2019  ? GFRAA >60 07/21/2019  ? ? ?Speciality Comments: No specialty comments available. ? ?Procedures:  ?No procedures performed ?Allergies:  Patient has no known allergies.  ? ? ?Assessment / Plan:     ?Visit Diagnoses: Positive ANA (antinuclear antibody) - AVISE results from 05/26/21: Index is negative -1.6.  ANA 1:160 nuclear, dense fine speckled.  Anti-phosphatidylserine IgG positive.   ?Lab work from 08/31/2021 was reviewed today in the office: ANA 1:320 nuclear, dense fine speckled, complements within normal limits, ESR within normal limits, lupus anticoagulant negative, phosphatidylserine antibodies negative.  All questions were addressed.  Discussed that these results are reassuring and she does not meet clinical criteria for systemic lupus at this time.  She has ongoing fatigue but has not developed any new or worsening symptoms since her last office visit.  No signs of alopecia, malar rash, Raynaud's phenomenon, oral or nasal ulcerations,  or cervical lymphadenopathy were noted.  Her lungs were clear to auscultation on examination today.  She had no synovitis on exam. ?She does not require immunosuppressive therapy at this time. ?Discussed signs and symptoms of autoimmune disease to monitor for closely.  She was advised to notify us if she develops any new or worsening symptoms.  She will follow-up in the office in 1 year at which time we will recheck autoimmune lab work. ? ?DDD (degenerative disc disease), lumbar: She has no midline spinal tenderness.  No symptoms of radiculopathy at this time. ? ?Other fatigue: She continues to have persistent fatigue on a daily basis.  She has undergone a thorough work-up by her PCP including to sleep studies.  Discussed the importance of regular exercise. ? ?Hypersomnia: According to the patient if she is sedentary for longer than 5 to 10 minutes she will fall asleep.  She has had 2 sleep studies which were unremarkable per patient.  She plans on following back up with her PCP. ? ?Other medical conditions are listed as follows: ? ?Dyslipidemia ? 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  ? ?History of gastroesophageal reflux (GERD) ? ?History of asthma ? ?Anxiety and depression ? ?History of ST elevation myocardial infarction (STEMI) - 2018. ? ?Former smoker - 1PPD x 10 year

## 2021-08-31 ENCOUNTER — Other Ambulatory Visit: Payer: Self-pay | Admitting: *Deleted

## 2021-08-31 DIAGNOSIS — R768 Other specified abnormal immunological findings in serum: Secondary | ICD-10-CM

## 2021-09-04 NOTE — Progress Notes (Signed)
ANA remains positive.   ?ESR and complements WNL.  ?Phosphatidylserine antibodies negative. We can further discuss results at her upcoming follow up visit.

## 2021-09-06 LAB — PHOSPHATIDYLSERINE/PROTHROMBIN (PS/PT) ANTIBODIES (IGG, IGM)
Phosphatidylserine/Prothrombin Ab (IgG): 20 U (ref <=30)
Phosphatidylserine/Prothrombin Ab (IgM): 10 U (ref <=30)

## 2021-09-06 LAB — C3 AND C4
C3 Complement: 141 mg/dL (ref 83–193)
C4 Complement: 31 mg/dL (ref 15–57)

## 2021-09-06 LAB — ANTI-NUCLEAR AB-TITER (ANA TITER): ANA Titer 1: 1:320 {titer} — ABNORMAL HIGH

## 2021-09-06 LAB — ANA: Anti Nuclear Antibody (ANA): POSITIVE — AB

## 2021-09-06 LAB — SEDIMENTATION RATE: Sed Rate: 2 mm/h (ref 0–20)

## 2021-09-06 LAB — LUPUS ANTICOAGULANT EVAL W/ REFLEX
PTT-LA Screen: 33 s (ref <=40)
dRVVT: 37 s (ref <=45)

## 2021-09-06 NOTE — Progress Notes (Signed)
Lupus anticoagulant not detected.

## 2021-09-11 ENCOUNTER — Ambulatory Visit: Payer: Medicaid Other | Admitting: Physician Assistant

## 2021-09-11 ENCOUNTER — Encounter: Payer: Self-pay | Admitting: Physician Assistant

## 2021-09-11 VITALS — BP 128/84 | HR 67 | Ht 67.0 in | Wt 217.8 lb

## 2021-09-11 DIAGNOSIS — Z87891 Personal history of nicotine dependence: Secondary | ICD-10-CM

## 2021-09-11 DIAGNOSIS — I252 Old myocardial infarction: Secondary | ICD-10-CM

## 2021-09-11 DIAGNOSIS — R768 Other specified abnormal immunological findings in serum: Secondary | ICD-10-CM

## 2021-09-11 DIAGNOSIS — F32A Depression, unspecified: Secondary | ICD-10-CM

## 2021-09-11 DIAGNOSIS — R5383 Other fatigue: Secondary | ICD-10-CM

## 2021-09-11 DIAGNOSIS — M5136 Other intervertebral disc degeneration, lumbar region: Secondary | ICD-10-CM | POA: Diagnosis not present

## 2021-09-11 DIAGNOSIS — G4710 Hypersomnia, unspecified: Secondary | ICD-10-CM

## 2021-09-11 DIAGNOSIS — F419 Anxiety disorder, unspecified: Secondary | ICD-10-CM

## 2021-09-11 DIAGNOSIS — I2542 Coronary artery dissection: Secondary | ICD-10-CM

## 2021-09-11 DIAGNOSIS — Z8719 Personal history of other diseases of the digestive system: Secondary | ICD-10-CM

## 2021-09-11 DIAGNOSIS — Z8709 Personal history of other diseases of the respiratory system: Secondary | ICD-10-CM

## 2021-09-11 DIAGNOSIS — E785 Hyperlipidemia, unspecified: Secondary | ICD-10-CM

## 2022-01-16 ENCOUNTER — Other Ambulatory Visit: Payer: Self-pay

## 2022-01-16 MED ORDER — NITROGLYCERIN 0.4 MG SL SUBL: SUBLINGUAL_TABLET | SUBLINGUAL | 0 refills | Status: DC

## 2022-02-01 ENCOUNTER — Other Ambulatory Visit: Payer: Self-pay | Admitting: Cardiovascular Disease

## 2022-05-28 ENCOUNTER — Other Ambulatory Visit: Payer: Self-pay | Admitting: Cardiovascular Disease

## 2022-06-01 ENCOUNTER — Other Ambulatory Visit: Payer: Self-pay | Admitting: Cardiovascular Disease

## 2022-06-04 ENCOUNTER — Other Ambulatory Visit: Payer: Self-pay | Admitting: *Deleted

## 2022-06-04 MED ORDER — NITROGLYCERIN 0.4 MG SL SUBL: SUBLINGUAL_TABLET | SUBLINGUAL | 0 refills | Status: DC

## 2022-08-15 ENCOUNTER — Other Ambulatory Visit: Payer: Self-pay | Admitting: Family Medicine

## 2022-08-15 DIAGNOSIS — Z1231 Encounter for screening mammogram for malignant neoplasm of breast: Secondary | ICD-10-CM

## 2022-08-22 ENCOUNTER — Ambulatory Visit
Admission: RE | Admit: 2022-08-22 | Discharge: 2022-08-22 | Disposition: A | Discharge status: Home / Self Care | Payer: Medicaid Other | Source: Ambulatory Visit | Attending: Family Medicine | Admitting: Family Medicine

## 2022-08-22 DIAGNOSIS — Z1231 Encounter for screening mammogram for malignant neoplasm of breast: Secondary | ICD-10-CM

## 2022-08-29 NOTE — Progress Notes (Signed)
Office Visit Note  Patient: Kristi Chambers             Date of Birth: Sep 06, 1980           MRN: [id number]             PCP: Mikael Spray, NP Referring: Mikael Spray, NP Visit Date: 09/12/2022 Occupation: @GUAROCC @  Subjective:  Pain in both knees  History of Present Illness: JUHI TIEU is a 42 y.o. female with history of positive ANA and fatigue.  She returns today  after her last visit in May 2023.  She denies any history of oral ulcers, nasal ulcers, malar rash, photosensitivity, Raynaud's or lymphadenopathy.  She continues to have some fatigue.  She states she had a sleep study which was negative for sleep apnea.  She has been experiencing joint stiffness and discomfort in her knee joints.  None of the other joints are painful.  She has been using sunscreen on a regular basis.    Activities of Daily Living:  Patient reports morning stiffness for 30 minutes.   Patient Reports nocturnal pain.  Difficulty dressing/grooming: Denies Difficulty climbing stairs: Denies Difficulty getting out of chair: Denies Difficulty using hands for taps, buttons, cutlery, and/or writing: Denies  Review of Systems  Constitutional:  Positive for fatigue.  HENT:  Negative for mouth sores and mouth dryness.   Eyes:  Negative for dryness.  Respiratory:  Negative for shortness of breath.   Cardiovascular:  Negative for chest pain and palpitations.  Gastrointestinal:  Negative for blood in stool, constipation and diarrhea.  Endocrine: Negative for increased urination.  Genitourinary:  Negative for involuntary urination.  Musculoskeletal:  Positive for joint pain, joint pain and morning stiffness. Negative for gait problem, joint swelling, myalgias, muscle weakness, muscle tenderness and myalgias.  Skin:  Negative for color change, rash, hair loss and sensitivity to sunlight.  Allergic/Immunologic: Negative for susceptible to infections.  Neurological:  Negative for dizziness and headaches.   Hematological:  Negative for swollen glands.  Psychiatric/Behavioral:  Positive for sleep disturbance. Negative for depressed mood. The patient is not nervous/anxious.     PMFS History:  Patient Active Problem List   Diagnosis Date Noted   BMI 30.0-30.9,adult    Dyslipidemia 05/05/2017   ADD (attention deficit disorder) 05/05/2017   History of asthma 05/05/2017   Chest pain 05/03/2017   STEMI (ST elevation myocardial infarction) [facility])    Coronary artery dissection     Past Medical History:  Diagnosis Date   Asthma    BMI 30.0-30.9,adult     Family History  Problem Relation Age of Onset   High blood pressure Mother    Heart disease Father    High blood pressure Father    Heart attack Father    Stroke Father        cabg   Healthy Son    Breast cancer Neg Hx    Past Surgical History:  Procedure Laterality Date   LEFT HEART CATH AND CORONARY ANGIOGRAPHY N/A 05/03/2017   Procedure: LEFT HEART CATH AND CORONARY ANGIOGRAPHY;  Surgeon: Corky Crafts, MD;  Location: [facility];  Service: Cardiovascular;  Laterality: N/A;   SEPTOPLASTY  05/2021   Social History   Social History Narrative   Not on file   There is no immunization history for the selected administration types on file for this patient.   Objective: Vital Signs: BP 125/76 (BP Location: Left Arm, Patient Position: Sitting, Cuff Size: Large)   Pulse  80   Resp 15   Ht 5\' 7"  (1.702 m)   Wt 212 lb 6.4 oz (96.3 kg)   BMI 33.27 kg/m    Physical Exam Vitals and nursing note reviewed.  Constitutional:      Appearance: She is well-developed.  HENT:     Head: Normocephalic and atraumatic.  Eyes:     Conjunctiva/sclera: Conjunctivae normal.  Cardiovascular:     Rate and Rhythm: Normal rate and regular rhythm.     Heart sounds: Normal heart sounds.  Pulmonary:     Effort: Pulmonary effort is normal.     Breath sounds: Normal breath sounds.  Abdominal:     General: Bowel sounds are normal.      Palpations: Abdomen is soft.  Musculoskeletal:     Cervical back: Normal range of motion.  Lymphadenopathy:     Cervical: No cervical adenopathy.  Skin:    General: Skin is warm and dry.     Capillary Refill: Capillary refill takes less than 2 seconds.  Neurological:     Mental Status: She is alert and oriented to person, place, and time.  Psychiatric:        Behavior: Behavior normal.      Musculoskeletal Exam: Cervical, thoracic and lumbar spine were in good range of motion.  Shoulders, elbows, wrist joints, MCPs PIPs and DIPs were in good range of motion with no synovitis.  Hip joints and knee joints in good range of motion without any warmth swelling or effusion.  There was no tenderness over ankles or MTPs.  CDAI Exam: CDAI Score: -- Patient Global: --; Provider Global: -- Swollen: --; Tender: -- Joint Exam 09/12/2022   No joint exam has been documented for this visit   There is currently no information documented on the homunculus. Go to the Rheumatology activity and complete the homunculus joint exam.  Investigation: No additional findings.  Imaging: MM 3D SCREENING MAMMOGRAM BILATERAL BREAST  Result Date: 08/23/2022 CLINICAL DATA:  Screening. EXAM: DIGITAL SCREENING BILATERAL MAMMOGRAM WITH TOMOSYNTHESIS AND CAD TECHNIQUE: Bilateral screening digital craniocaudal and mediolateral oblique mammograms were obtained. Bilateral screening digital breast tomosynthesis was performed. The images were evaluated with computer-aided detection. COMPARISON:  None available. ACR Breast Density Category a: The breasts are almost entirely fatty. FINDINGS: There are no findings suspicious for malignancy. IMPRESSION: No mammographic evidence of malignancy. A result letter of this screening mammogram will be mailed directly to the patient. RECOMMENDATION: Screening mammogram in one year. (Code:SM-B-01Y) BI-RADS CATEGORY  1: Negative. Electronically Signed   By: Frederico Hamman M.D.   On:  08/23/2022 14:57    Recent Labs: Lab Results  Component Value Date   WBC 9.9 07/21/2019   HGB 13.5 07/21/2019   PLT 303 07/21/2019   NA 140 07/21/2019   K 3.8 07/21/2019   CL 103 07/21/2019   CO2 24 07/21/2019   GLUCOSE 108 (H) 07/21/2019   BUN 11 07/21/2019   CREATININE 0.67 07/21/2019   BILITOT 0.7 07/21/2019   ALKPHOS 64 07/21/2019   AST 21 07/21/2019   ALT 22 07/21/2019   PROT 6.4 (L) 07/21/2019   ALBUMIN 3.9 07/21/2019   CALCIUM 8.9 07/21/2019   GFRAA >60 07/21/2019    Speciality Comments: No specialty comments available.  Procedures:  No procedures performed Allergies: Patient has no known allergies.   Assessment / Plan:     Visit Diagnoses: Positive ANA (antinuclear antibody) - AVISE results from 05/26/21: Index is negative -1.6.  ANA 1:160 nuclear, dense fine speckled.  Anti-phosphatidylserine IgG positive. -Repeat phosphatidylserine antibodies were negative.  Her ANA remains positive.  There is no history of oral ulcers, nasal ulcers, malar rash, photosensitivity, Raynaud's, lymphadenopathy or inflammatory arthritis.  She continues to have fatigue.  I will check autoimmune labs today.  Use of sunscreen was advised.  Plan: Protein / creatinine ratio, urine, CBC with Differential/Platelet, COMPLETE METABOLIC PANEL WITH GFR, ANA, Anti-DNA antibody, double-stranded, C3 and C4, Sedimentation rate.  Will contact her once the lab results are available.  Chronic pain of both knees-she had been experiencing some discomfort in her knee joints and stiffness.  No warmth swelling or effusion was noted.  A handout on lower extremity muscle strength exercises was given.  DDD (degenerative disc disease), lumbar-she has intermittent discomfort in her lower back.  She denies any radiculopathy.  A handout on back exercises was given.  Other fatigue -she continues to have some fatigue.  She had sleep study which was negative for sleep apnea.  Hypersomnia -she continues to have  hypersomnia.  Other medical problems are listed as follows:  Dyslipidemia  Coronary artery dissection - spontaneous coronary dissection of mid LAD 2018.  History of asthma  History of gastroesophageal reflux (GERD)  Anxiety and depression  History of ST elevation myocardial infarction (STEMI) - 2018.  Former smoker - 1PPD x 10 years. Quit in 2013.  Her boyfriend still smokes.  Chest x-ray was normal in 2021.  Orders: Orders Placed This Encounter  Procedures   Protein / creatinine ratio, urine   CBC with Differential/Platelet   COMPLETE METABOLIC PANEL WITH GFR   ANA   Anti-DNA antibody, double-stranded   C3 and C4   Sedimentation rate   No orders of the defined types were placed in this encounter.    Follow-Up Instructions: Return in about 1 year (around 09/12/2023) for +ANA.   Pollyann Savoy, MD  Note - This record has been created using Animal nutritionist.  Chart creation errors have been sought, but may not always  have been located. Such creation errors do not reflect on  the standard of medical care.

## 2022-09-12 ENCOUNTER — Ambulatory Visit: Payer: Medicaid Other | Attending: Rheumatology | Admitting: Rheumatology

## 2022-09-12 ENCOUNTER — Encounter: Payer: Self-pay | Admitting: Rheumatology

## 2022-09-12 VITALS — BP 125/76 | HR 80 | Resp 15 | Ht 67.0 in | Wt 212.4 lb

## 2022-09-12 DIAGNOSIS — F419 Anxiety disorder, unspecified: Secondary | ICD-10-CM

## 2022-09-12 DIAGNOSIS — M25562 Pain in left knee: Secondary | ICD-10-CM

## 2022-09-12 DIAGNOSIS — M25561 Pain in right knee: Secondary | ICD-10-CM

## 2022-09-12 DIAGNOSIS — G4710 Hypersomnia, unspecified: Secondary | ICD-10-CM

## 2022-09-12 DIAGNOSIS — E785 Hyperlipidemia, unspecified: Secondary | ICD-10-CM

## 2022-09-12 DIAGNOSIS — M5136 Other intervertebral disc degeneration, lumbar region: Secondary | ICD-10-CM | POA: Diagnosis not present

## 2022-09-12 DIAGNOSIS — G8929 Other chronic pain: Secondary | ICD-10-CM

## 2022-09-12 DIAGNOSIS — Z8719 Personal history of other diseases of the digestive system: Secondary | ICD-10-CM

## 2022-09-12 DIAGNOSIS — F32A Depression, unspecified: Secondary | ICD-10-CM

## 2022-09-12 DIAGNOSIS — I2542 Coronary artery dissection: Secondary | ICD-10-CM

## 2022-09-12 DIAGNOSIS — Z8709 Personal history of other diseases of the respiratory system: Secondary | ICD-10-CM

## 2022-09-12 DIAGNOSIS — R5383 Other fatigue: Secondary | ICD-10-CM | POA: Diagnosis not present

## 2022-09-12 DIAGNOSIS — R768 Other specified abnormal immunological findings in serum: Secondary | ICD-10-CM | POA: Diagnosis not present

## 2022-09-12 DIAGNOSIS — I252 Old myocardial infarction: Secondary | ICD-10-CM

## 2022-09-12 DIAGNOSIS — Z87891 Personal history of nicotine dependence: Secondary | ICD-10-CM

## 2022-09-12 LAB — CBC WITH DIFFERENTIAL/PLATELET
Lymphs Abs: 1399 cells/uL (ref 850–3900)
MCH: 28.8 pg (ref 27.0–33.0)
Platelets: 355 10*3/uL (ref 140–400)
Total Lymphocyte: 19.7 %
WBC: 7.1 10*3/uL (ref 3.8–10.8)

## 2022-09-12 LAB — SEDIMENTATION RATE: Sed Rate: 6 mm/h (ref 0–20)

## 2022-09-12 NOTE — Patient Instructions (Addendum)
Exercises for Chronic Knee Pain Chronic knee pain is pain that lasts longer than 3 months. For most people with chronic knee pain, exercise and weight loss is an important part of treatment. Your health care provider may want you to focus on: Strengthening the muscles that support your knee. This can take pressure off your knee and lessen pain. Preventing knee stiffness. Maintaining or increasing how far you can move your knee. Losing weight (if this applies) to take pressure off your knee, decrease your risk for injury, and make it easier for you to exercise. Your health care provider will help you develop an exercise program that matches your needs and physical abilities. Below are simple, low-impact exercises you can do at home. Ask your health care provider or a physical therapist how often you should do your exercise program and how many times to repeat each exercise. General safety tips Follow these safety tips for exercising with chronic knee pain: Get your health care provider's approval before doing any exercises. Start slowly and stop any time an exercise causes pain. Do not exercise if your knee pain is flaring up. Warm up first. Stretching a cold muscle can cause an injury. Do 5-10 minutes of easy movement or light stretching before beginning your exercise routine. Do 5-10 minutes of low-impact activity (like walking or cycling) before starting strengthening exercises. Contact your health care provider any time you have pain during or after exercising. Exercise may cause discomfort but should not be painful. It is normal to be a little stiff or sore after exercising.  Stretching and range-of-motion exercises Front thigh stretch  Stand up straight and support your body by holding on to a chair or resting one hand on a wall. With your legs straight and close together, bend one knee to lift your heel up toward your buttocks. Using one hand for support, grab your ankle with your free  hand. Pull your foot up closer toward your buttocks to feel the stretch in front of your thigh. Hold the stretch for 30 seconds. Repeat __________ times. Complete this exercise __________ times a day. Back thigh stretch  Sit on the floor with your back straight and your legs out straight in front of you. Place the palms of your hands on the floor and slide them toward your feet as you bend at the hip. Try to touch your nose to your knees and feel the stretch in the back of your thighs. Hold for 30 seconds. Repeat __________ times. Complete this exercise __________ times a day. Calf stretch  Stand facing a wall. Place the palms of your hands flat against the wall, arms extended, and lean slightly against the wall. Get into a lunge position with one leg bent at the knee and the other leg stretched out straight behind you. Keep both feet facing the wall and increase the bend in your knee while keeping the heel of the other leg flat on the ground. You should feel the stretch in your calf. Hold for 30 seconds. Repeat __________ times. Complete this exercise __________ times a day. Strengthening exercises Straight leg lift Lie on your back with one knee bent and the other leg out straight. Slowly lift the straight leg without bending the knee. Lift until your foot is about 12 inches (30 cm) off the floor. Hold for 3-5 seconds and slowly lower your leg. Repeat __________ times. Complete this exercise __________ times a day. Single leg dip Stand between two chairs and put both hands on the   backs of the chairs for support. Extend one leg out straight with your body weight resting on the heel of the standing leg. Slowly bend your standing knee to dip your body to the level that is comfortable for you. Hold for 3-5 seconds. Repeat __________ times. Complete this exercise __________ times a day. Hamstring curls Stand straight, knees close together, facing the back of a chair. Hold on to the  back of a chair with both hands. Keep one leg straight. Bend the other knee while bringing the heel up toward the buttock until the knee is bent at a 90-degree angle (right angle). Hold for 3-5 seconds. Repeat __________ times. Complete this exercise __________ times a day. Wall squat Stand straight with your back, hips, and head against a wall. Step forward one foot at a time with your back still against the wall. Your feet should be 2 feet (61 cm) from the wall at shoulder width. Keeping your back, hips, and head against the wall, slide down the wall to as close of a sitting position as you can get. Hold for 5-10 seconds, then slowly slide back up. Repeat __________ times. Complete this exercise __________ times a day. Step-ups Step up with one foot onto a sturdy platform or stool that is about 6 inches (15 cm) high. Face sideways with one foot on the platform and one on the ground. Place all your weight on the platform foot and lift your body off the ground until your knee extends. Let your other leg hang free to the side. Hold for 3-5 seconds then slowly lower your weight down to the floor foot. Repeat __________ times. Complete this exercise __________ times a day. Contact a health care provider if: Your exercise causes pain. Your pain is worse after you exercise. Your pain prevents you from doing your exercises. This information is not intended to replace advice given to you by your health care provider. Make sure you discuss any questions you have with your health care provider. Document Revised: 08/27/2019 Document Reviewed: 04/20/2019 Elsevier Patient Education  2023 [employer] Back Exercises The following exercises strengthen the muscles that help to support the trunk (torso) and back. They also help to keep the lower back flexible. Doing these exercises can help to prevent or lessen existing low back pain. If you have back pain or discomfort, try doing these exercises 2-3  times each day or as told by your health care provider. As your pain improves, do them once each day, but increase the number of times that you repeat the steps for each exercise (do more repetitions). To prevent the recurrence of back pain, continue to do these exercises once each day or as told by your health care provider. Do exercises exactly as told by your health care provider and adjust them as directed. It is normal to feel mild stretching, pulling, tightness, or discomfort as you do these exercises, but you should stop right away if you feel sudden pain or your pain gets worse. Exercises Single knee to chest Repeat these steps 3-5 times for each leg: Lie on your back on a firm bed or the floor with your legs extended. Bring one knee to your chest. Your other leg should stay extended and in contact with the floor. Hold your knee in place by grabbing your knee or thigh with both hands and hold. Pull on your knee until you feel a gentle stretch in your lower back or buttocks. Hold the stretch for 10-30 seconds. Slowly   release and straighten your leg.  Pelvic tilt Repeat these steps 5-10 times: Lie on your back on a firm bed or the floor with your legs extended. Bend your knees so they are pointing toward the ceiling and your feet are flat on the floor. Tighten your lower abdominal muscles to press your lower back against the floor. This motion will tilt your pelvis so your tailbone points up toward the ceiling instead of pointing to your feet or the floor. With gentle tension and even breathing, hold this position for 5-10 seconds.  Cat-cow Repeat these steps until your lower back becomes more flexible: Get into a hands-and-knees position on a firm bed or the floor. Keep your hands under your shoulders, and keep your knees under your hips. You may place padding under your knees for comfort. Let your head hang down toward your chest. Contract your abdominal muscles and point your  tailbone toward the floor so your lower back becomes rounded like the back of a cat. Hold this position for 5 seconds. Slowly lift your head, let your abdominal muscles relax, and point your tailbone up toward the ceiling so your back forms a sagging arch like the back of a cow. Hold this position for 5 seconds.  Press-ups Repeat these steps 5-10 times: Lie on your abdomen (face-down) on a firm bed or the floor. Place your palms near your head, about shoulder-width apart. Keeping your back as relaxed as possible and keeping your hips on the floor, slowly straighten your arms to raise the top half of your body and lift your shoulders. Do not use your back muscles to raise your upper torso. You may adjust the placement of your hands to make yourself more comfortable. Hold this position for 5 seconds while you keep your back relaxed. Slowly return to lying flat on the floor.  Bridges Repeat these steps 10 times: Lie on your back on a firm bed or the floor. Bend your knees so they are pointing toward the ceiling and your feet are flat on the floor. Your arms should be flat at your sides, next to your body. Tighten your buttocks muscles and lift your buttocks off the floor until your waist is at almost the same height as your knees. You should feel the muscles working in your buttocks and the back of your thighs. If you do not feel these muscles, slide your feet 1-2 inches (2.5-5 cm) farther away from your buttocks. Hold this position for 3-5 seconds. Slowly lower your hips to the starting position, and allow your buttocks muscles to relax completely. If this exercise is too easy, try doing it with your arms crossed over your chest. Abdominal crunches Repeat these steps 5-10 times: Lie on your back on a firm bed or the floor with your legs extended. Bend your knees so they are pointing toward the ceiling and your feet are flat on the floor. Cross your arms over your chest. Tip your chin slightly  toward your chest without bending your neck. Tighten your abdominal muscles and slowly raise your torso high enough to lift your shoulder blades a tiny bit off the floor. Avoid raising your torso higher than that because it can put too much stress on your lower back and does not help to strengthen your abdominal muscles. Slowly return to your starting position.  Back lifts Repeat these steps 5-10 times: Lie on your abdomen (face-down) with your arms at your sides, and rest your forehead on the floor. Tighten the   muscles in your legs and your buttocks. Slowly lift your chest off the floor while you keep your hips pressed to the floor. Keep the back of your head in line with the curve in your back. Your eyes should be looking at the floor. Hold this position for 3-5 seconds. Slowly return to your starting position.  Contact a health care provider if: Your back pain or discomfort gets much worse when you do an exercise. Your worsening back pain or discomfort does not lessen within 2 hours after you exercise. If you have any of these problems, stop doing these exercises right away. Do not do them again unless your health care provider says that you can. Get help right away if: You develop sudden, severe back pain. If this happens, stop doing the exercises right away. Do not do them again unless your health care provider says that you can. This information is not intended to replace advice given to you by your health care provider. Make sure you discuss any questions you have with your health care provider. Document Revised: 10/18/2020 Document Reviewed: 07/06/2020 Elsevier Patient Education  2023 [employer] Inc[employer]  

## 2022-09-13 LAB — ANTI-DNA ANTIBODY, DOUBLE-STRANDED: ds DNA Ab: <1 IU/mL

## 2022-09-13 LAB — PROTEIN / CREATININE RATIO, URINE
Creatinine, Urine: 116 mg/dL (ref 20–275)
Protein/Creat Ratio: 86 mg/g creat (ref 24–184)
Total Protein, Urine: 10 mg/dL (ref 5–24)

## 2022-09-13 LAB — CBC WITH DIFFERENTIAL/PLATELET
Basophils Absolute: 57 cells/uL (ref 0–200)
HCT: 40.3 % (ref 35.0–45.0)
Hemoglobin: 13.3 g/dL (ref 11.7–15.5)
MPV: 10.7 fL (ref 7.5–12.5)
Monocytes Relative: 8 %
Neutrophils Relative %: 60.7 %

## 2022-09-13 LAB — COMPLETE METABOLIC PANEL WITH GFR
AG Ratio: 1.8 (calc) (ref 1.0–2.5)
AST: 15 U/L (ref 10–30)
Sodium: 139 mmol/L (ref 135–146)

## 2022-09-14 LAB — CBC WITH DIFFERENTIAL/PLATELET
Absolute Monocytes: 568 cells/uL (ref 200–950)
Basophils Relative: 0.8 %
Eosinophils Absolute: 767 cells/uL — ABNORMAL HIGH (ref 15–500)
Eosinophils Relative: 10.8 %
MCHC: 33 g/dL (ref 32.0–36.0)
MCV: 87.2 fL (ref 80.0–100.0)
Neutro Abs: 4310 cells/uL (ref 1500–7800)
RBC: 4.62 10*6/uL (ref 3.80–5.10)
RDW: 13.4 % (ref 11.0–15.0)

## 2022-09-14 LAB — ANTI-NUCLEAR AB-TITER (ANA TITER): ANA Titer 1: 1:320 {titer} — ABNORMAL HIGH

## 2022-09-14 LAB — COMPLETE METABOLIC PANEL WITH GFR
ALT: 14 U/L (ref 6–29)
Albumin: 4.3 g/dL (ref 3.6–5.1)
Alkaline phosphatase (APISO): 73 U/L (ref 31–125)
BUN: 13 mg/dL (ref 7–25)
CO2: 24 mmol/L (ref 20–32)
Calcium: 9 mg/dL (ref 8.6–10.2)
Chloride: 106 mmol/L (ref 98–110)
Creat: 0.64 mg/dL (ref 0.50–0.99)
Globulin: 2.4 g/dL (calc) (ref 1.9–3.7)
Glucose, Bld: 94 mg/dL (ref 65–99)
Potassium: 4.5 mmol/L (ref 3.5–5.3)
Total Bilirubin: 0.4 mg/dL (ref 0.2–1.2)
Total Protein: 6.7 g/dL (ref 6.1–8.1)
eGFR: 115 mL/min/{1.73_m2} (ref >=60)

## 2022-09-14 LAB — C3 AND C4
C3 Complement: 130 mg/dL (ref 83–193)
C4 Complement: 31 mg/dL (ref 15–57)

## 2022-09-14 LAB — PROTEIN / CREATININE RATIO, URINE: Protein/Creatinine Ratio: 0.086 mg/mg creat (ref 0.024–0.184)

## 2022-09-14 LAB — ANA: Anti Nuclear Antibody (ANA): POSITIVE — AB

## 2022-09-16 NOTE — Progress Notes (Signed)
ANA titer is stable double-stranded DNA negative.  Labs do not indicate an autoimmune disease flare.

## 2022-12-28 ENCOUNTER — Telehealth: Payer: Self-pay

## 2022-12-28 NOTE — Telephone Encounter (Signed)
   Pre-operative Risk Assessment    Patient Name: Kristi Chambers  DOB: 28-Jan-1981 MRN: [id number]   Last OV: 09/23/20 with Dr. Eden Emms Next OV: None   Request for Surgical Clearance    Procedure:   Left Knee Scope ACL Reconstruction and Lateral Meniscus Repair  Date of Surgery:  Clearance TBD                                 Surgeon:  Dr. Duwayne Heck Surgeon's Group or Practice Name:  [facility] Phone number:  [phone] Fax number:  [phone]   Type of Clearance Requested:   - Medical  - Pharmacy:  Hold Aspirin pt will need instructions on when/if to hold   Type of Anesthesia:   Choice   Additional requests/questions:    SignedZada Finders   12/28/2022, 10:13 AM

## 2022-12-28 NOTE — Telephone Encounter (Signed)
Pt is scheduled for in-office preop clearance on 09/03 at 8:50am with Alden Server. I will forward to him.

## 2023-01-07 NOTE — Progress Notes (Unsigned)
  Cardiology Office Note:  .   Date:  01/07/2023  ID:  Kristi Chambers, DOB 10/19/1980, MRN [id number] PCP: Kristi Spray, NP  [facility] Providers Cardiologist:  Charlton Haws, MD { Click to update primary MD,subspecialty MD or APP then REFRESH:1}   History of Present Illness: .   Kristi Chambers is a 42 y.o. 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  Kristi Chambers was seen initially in 2018 when she presented to the ED with complaint of substernal chest pain and EKG was completed showing acute ST elevation.  She underwent a urgent LHC that showed scad mid-distal LAD with 40% stenosis and TIMI-3 flow.  He underwent 2D echo that showed EF of 50-55% with distal anterior apical and apical hypokinesis 1 DD LV filling pressure.  She was treated with GDMT and advised to discontinue Adderall.  She has continued to do well since her ED visit in 2018. Kristi Chambers was last seen in our office by Dr. Eden Emms on 09/23/2020 for follow-up and was doing well with no complaints of angina.   Today patient reports***.  Patient denies chest pain, palpitations, dyspnea, PND, orthopnea, nausea, vomiting, dizziness, syncope, edema, weight gain, or early satiety.   Notes: -Need instruction for holding aspirin for ACL reconstruction and meniscus repair  ROS: Please see the history of present illness.    (+)*** (+)***   EKG/LABS/ Recent Cardiac Studies@   ECG personally reviewed by me today - *** Lab Results  Component Value Date   CREATININE 0.64 09/12/2022   BUN 13 09/12/2022   NA 139 09/12/2022   K 4.5 09/12/2022   CL 106 09/12/2022   CO2 24 09/12/2022   Lab Results  Component Value Date   CHOL 135 12/23/2017   HDL 42 12/23/2017   LDLCALC 72 12/23/2017   TRIG 105 12/23/2017   CHOLHDL 3.2 12/23/2017        Risk Assessment/Calculations:   {Does this patient have ATRIAL FIBRILLATION?:[phone]} No BP  recorded.  {Refresh Note OR Click here to enter BP  :1}***       Physical Exam:   VS:  There were no vitals taken for this visit.   Wt Readings from Last 3 Encounters:  09/12/22 212 lb 6.4 oz (96.3 kg)  09/11/21 217 lb 12.8 oz (98.8 kg)  06/13/21 219 lb (99.3 kg)    GEN: Well nourished, well developed in no acute distress NECK: No JVD; No carotid bruits CARDIAC: ***RRR, no murmurs, rubs, gallops RESPIRATORY:  Clear to auscultation without rales, wheezing or rhonchi  ABDOMEN: Soft, non-tender, non-distended EXTREMITIES:  No edema; No deformity   ASSESSMENT AND PLAN: .   1.  Preop clearance: -Patient's RCRI score is 0.9%  2.CAD: -s/p scad to M LAD in 2018 with no residual angina since event -Today patient is***  3.  Hyperlipidemia:  4.  Obesity:    {Are you ordering a CV Procedure (e.g. stress test, cath, DCCV, TEE, etc)?   Press F2        :[id number]}  Dispo: Follow-up with Charlton Haws, MD or APP in *** months  Signed, Napoleon Form, Leodis Rains, NP

## 2023-01-08 ENCOUNTER — Encounter: Payer: Self-pay | Admitting: Nurse Practitioner

## 2023-01-08 ENCOUNTER — Ambulatory Visit: Payer: Medicaid Other | Attending: Nurse Practitioner | Admitting: Nurse Practitioner

## 2023-01-08 VITALS — BP 126/88 | HR 67 | Ht 67.0 in | Wt 217.6 lb

## 2023-01-08 DIAGNOSIS — E785 Hyperlipidemia, unspecified: Secondary | ICD-10-CM | POA: Diagnosis not present

## 2023-01-08 DIAGNOSIS — I2542 Coronary artery dissection: Secondary | ICD-10-CM | POA: Diagnosis not present

## 2023-01-08 DIAGNOSIS — R011 Cardiac murmur, unspecified: Secondary | ICD-10-CM

## 2023-01-08 DIAGNOSIS — Z8709 Personal history of other diseases of the respiratory system: Secondary | ICD-10-CM | POA: Diagnosis not present

## 2023-01-08 DIAGNOSIS — Z01810 Encounter for preprocedural cardiovascular examination: Secondary | ICD-10-CM | POA: Diagnosis not present

## 2023-01-08 NOTE — Patient Instructions (Signed)
Medication Instructions:  Your physician recommends that you continue on your current medications as directed. Please refer to the Current Medication list given to you today. *If you need a refill on your cardiac medications before your next appointment, please call your pharmacy*   Lab Work: None ordered   Testing/Procedures: Your physician has requested that you have an echocardiogram. Echocardiography is a painless test that uses sound waves to create images of your heart. It provides your doctor with information about the size and shape of your heart and how well your heart's chambers and valves are working. This procedure takes approximately one hour. There are no restrictions for this procedure. Please do NOT wear cologne, perfume, aftershave, or lotions (deodorant is allowed). Please arrive 15 minutes prior to your appointment time.   Follow-Up: At [facility], you and your health needs are our priority.  As part of our continuing mission to provide you with exceptional heart care, we have created designated Provider Care Teams.  These Care Teams include your primary Cardiologist (physician) and Advanced Practice Providers (APPs -  Physician Assistants and Nurse Practitioners) who all work together to provide you with the care you need, when you need it.  We recommend signing up for the patient portal called "MyChart".  Sign up information is provided on this After Visit Summary.  MyChart is used to connect with patients for Virtual Visits (Telemedicine).  Patients are able to view lab/test results, encounter notes, upcoming appointments, etc.  Non-urgent messages can be sent to your provider as well.   To learn more about what you can do with MyChart, go to [email or web address].    Your next appointment:   FOLLOW UP AS NEEDED   Provider:   Charlton Haws, MD     Other Instructions YOU ARE CLEARED FOR YOUR PROCEDURE!

## 2023-01-22 NOTE — Progress Notes (Signed)
COVID Vaccine Completed:  Date of COVID positive in last 90 days:  PCP - Irma Lezlie Lye, MD Cardiologist - Charlton Haws, MD  Cardiac clearance by Robin Searing, NP 01/08/23 in Epic/ chart  Chest x-ray -  EKG - 01/08/23 Epic Stress Test -  ECHO - 01/23/23 Cardiac Cath - 05/03/17 Epic Pacemaker/ICD device last checked: Spinal Cord Stimulator:  Bowel Prep -   Sleep Study -  CPAP -   Fasting Blood Sugar -  Checks Blood Sugar _____ times a day  Last dose of GLP1 agonist-  N/A GLP1 instructions:  N/A   Last dose of SGLT-2 inhibitors-  N/A SGLT-2 instructions: N/A   Blood Thinner Instructions:  Time Aspirin Instructions: ASA 81, hold 7 days Last Dose:  Activity level:  Can go up a flight of stairs and perform activities of daily living without stopping and without symptoms of chest pain or shortness of breath.  Able to exercise without symptoms  Unable to go up a flight of stairs without symptoms of     Anesthesia review: STEMI, CAD, asthma  Patient denies shortness of breath, fever, cough and chest pain at PAT appointment  Patient verbalized understanding of instructions that were given to them at the PAT appointment. Patient was also instructed that they will need to review over the PAT instructions again at home before surgery.

## 2023-01-22 NOTE — Patient Instructions (Signed)
SURGICAL WAITING ROOM VISITATION  Patients having surgery or a procedure may have no more than 2 support people in the waiting area - these visitors may rotate.    Children under the age of 73 must have an adult with them who is not the patient.  Due to an increase in RSV and influenza rates and associated hospitalizations, children ages 61 and under may not visit patients in [facility] hospitals.  If the patient needs to stay at the hospital during part of their recovery, the visitor guidelines for inpatient rooms apply. Pre-op nurse will coordinate an appropriate time for 1 support person to accompany patient in pre-op.  This support person may not rotate.    Please refer to the [facility] website for the visitor guidelines for Inpatients (after your surgery is over and you are in a regular room).    Your procedure is scheduled on: 02/01/23   Report to [facility] Main Entrance    Report to admitting at 7:30 AM   Call this number if you have problems the morning of surgery [phone]   Do not eat food :After Midnight.   After Midnight you may have the following liquids until 6:45 AM DAY OF SURGERY  Water Non-Citrus Juices (without pulp, NO RED-Apple, White grape, White cranberry) Black Coffee (NO MILK/CREAM OR CREAMERS, sugar ok)  Clear Tea (NO MILK/CREAM OR CREAMERS, sugar ok) regular and decaf                             Plain Jell-O (NO RED)                                           Fruit ices (not with fruit pulp, NO RED)                                     Popsicles (NO RED)                                                               Sports drinks like Gatorade (NO RED)               The day of surgery:  Drink ONE (1) Pre-Surgery Clear Ensure at 6:45 AM the morning of surgery. Drink in one sitting. Do not sip.  This drink was given to you during your hospital  pre-op appointment visit. Nothing else to drink after completing the  Pre-Surgery Clear Ensure.           If you have questions, please contact your surgeon's office.   FOLLOW BOWEL PREP AND ANY ADDITIONAL PRE OP INSTRUCTIONS YOU RECEIVED FROM YOUR SURGEON'S OFFICE!!!     Oral Hygiene is also important to reduce your risk of infection.                                    Remember - BRUSH YOUR TEETH THE MORNING OF SURGERY WITH YOUR REGULAR TOOTHPASTE  DENTURES WILL BE REMOVED  PRIOR TO SURGERY PLEASE DO NOT APPLY "Poly grip" OR ADHESIVES!!!   Stop all vitamins and herbal supplements 7 days before surgery.   Take these medicines the morning of surgery with A SIP OF WATER: Albuterol, Atorvastatin, Inhalers, Carvedilol, Duloxetine, Omeprazole                               You may not have any metal on your body including hair pins, jewelry, and body piercing             Do not wear make-up, lotions, powders, perfumes, or deodorant  Do not wear nail polish including gel and S&S, artificial/acrylic nails, or any other type of covering on natural nails including finger and toenails. If you have artificial nails, gel coating, etc. that needs to be removed by a nail salon please have this removed prior to surgery or surgery may need to be canceled/ delayed if the surgeon/ anesthesia feels like they are unable to be safely monitored.   Do not shave  48 hours prior to surgery.    Do not bring valuables to the hospital. [facility] IS NOT             RESPONSIBLE   FOR VALUABLES.   Contacts, glasses, dentures or bridgework may not be worn into surgery.  DO NOT BRING YOUR HOME MEDICATIONS TO THE HOSPITAL. PHARMACY WILL DISPENSE MEDICATIONS LISTED ON YOUR MEDICATION LIST TO YOU DURING YOUR ADMISSION IN THE HOSPITAL!    Patients discharged on the day of surgery will not be allowed to drive home.  Someone NEEDS to stay with you for the first 24 hours after anesthesia.              Please read over the following fact sheets you were given: IF YOU HAVE QUESTIONS ABOUT YOUR PRE-OP INSTRUCTIONS PLEASE  CALL [name]  If you received a COVID test during your pre-op visit  it is requested that you wear a mask when out in public, stay away from anyone that may not be feeling well and notify your surgeon if you develop symptoms. If you test positive for Covid or have been in contact with anyone that has tested positive in the last 10 days please notify you surgeon.    [place] - Preparing for Surgery Before surgery, you can play an important role.  Because skin is not sterile, your skin needs to be as free of germs as possible.  You can reduce the number of germs on your skin by washing with CHG (chlorahexidine gluconate) soap before surgery.  CHG is an antiseptic cleaner which kills germs and bonds with the skin to continue killing germs even after washing. Please DO NOT use if you have an allergy to CHG or antibacterial soaps.  If your skin becomes reddened/irritated stop using the CHG and inform your nurse when you arrive at Short Stay. Do not shave (including legs and underarms) for at least 48 hours prior to the first CHG shower.  You may shave your face/neck.  Please follow these instructions carefully:  1.  Shower with CHG Soap the night before surgery and the  morning of surgery.  2.  If you choose to wash your hair, wash your hair first as usual with your normal  shampoo.  3.  After you shampoo, rinse your hair and body thoroughly to remove the shampoo.  4.  Use CHG as you would any other liquid soap.  You can apply chg directly to the skin and wash.  Gently with a scrungie or clean washcloth.  5.  Apply the CHG Soap to your body ONLY FROM THE NECK DOWN.   Do   not use on face/ open                           Wound or open sores. Avoid contact with eyes, ears mouth and   genitals (private parts).                       Wash face,  Genitals (private parts) with your normal soap.             6.  Wash thoroughly, paying special attention to the area where  your    surgery  will be performed.  7.  Thoroughly rinse your body with warm water from the neck down.  8.  DO NOT shower/wash with your normal soap after using and rinsing off the CHG Soap.                9.  Pat yourself dry with a clean towel.            10.  Wear clean pajamas.            11.  Place clean sheets on your bed the night of your first shower and do not  sleep with pets. Day of Surgery : Do not apply any lotions/deodorants the morning of surgery.  Please wear clean clothes to the hospital/surgery center.  FAILURE TO FOLLOW THESE INSTRUCTIONS MAY RESULT IN THE CANCELLATION OF YOUR SURGERY  PATIENT SIGNATURE_________________________________  NURSE SIGNATURE__________________________________  ________________________________________________________________________  Kristi Chambers  An incentive spirometer is a tool that can help keep your lungs clear and active. This tool measures how well you are filling your lungs with each breath. Taking long deep breaths may help reverse or decrease the chance of developing breathing (pulmonary) problems (especially infection) following: A long period of time when you are unable to move or be active. BEFORE THE PROCEDURE  If the spirometer includes an indicator to show your best effort, your nurse or respiratory therapist will set it to a desired goal. If possible, sit up straight or lean slightly forward. Try not to slouch. Hold the incentive spirometer in an upright position. INSTRUCTIONS FOR USE  Sit on the edge of your bed if possible, or sit up as far as you can in bed or on a chair. Hold the incentive spirometer in an upright position. Breathe out normally. Place the mouthpiece in your mouth and seal your lips tightly around it. Breathe in slowly and as deeply as possible, raising the piston or the ball toward the top of the column. Hold your breath for 3-5 seconds or for as long as possible. Allow the piston or ball to  fall to the bottom of the column. Remove the mouthpiece from your mouth and breathe out normally. Rest for a few seconds and repeat Steps 1 through 7 at least 10 times every 1-2 hours when you are awake. Take your time and take a few normal breaths between deep breaths. The spirometer may include an indicator to show your best effort. Use the indicator as a goal to work toward during each repetition. After each set of 10 deep breaths, practice coughing to be sure your  lungs are clear. If you have an incision (the cut made at the time of surgery), support your incision when coughing by placing a pillow or rolled up towels firmly against it. Once you are able to get out of bed, walk around indoors and cough well. You may stop using the incentive spirometer when instructed by your caregiver.  RISKS AND COMPLICATIONS Take your time so you do not get dizzy or light-headed. If you are in pain, you may need to take or ask for pain medication before doing incentive spirometry. It is harder to take a deep breath if you are having pain. AFTER USE Rest and breathe slowly and easily. It can be helpful to keep track of a log of your progress. Your caregiver can provide you with a simple table to help with this. If you are using the spirometer at home, follow these instructions: SEEK MEDICAL CARE IF:  You are having difficultly using the spirometer. You have trouble using the spirometer as often as instructed. Your pain medication is not giving enough relief while using the spirometer. You develop fever of 100.5 F (38.1 C) or higher. SEEK IMMEDIATE MEDICAL CARE IF:  You cough up bloody sputum that had not been present before. You develop fever of 102 F (38.9 C) or greater. You develop worsening pain at or near the incision site. MAKE SURE YOU:  Understand these instructions. Will watch your condition. Will get help right away if you are not doing well or get worse. Document Released: 09/03/2006  Document Revised: 07/16/2011 Document Reviewed: 11/04/2006 [facility] Patient Information 2014 [place], [place].   ________________________________________________________________________

## 2023-01-24 ENCOUNTER — Ambulatory Visit (HOSPITAL_COMMUNITY): Payer: Medicaid Other

## 2023-01-24 ENCOUNTER — Encounter (HOSPITAL_COMMUNITY): Payer: Self-pay

## 2023-01-24 ENCOUNTER — Encounter (HOSPITAL_COMMUNITY)
Admission: RE | Admit: 2023-01-24 | Discharge: 2023-01-24 | Disposition: A | Discharge status: Home / Self Care | Payer: Medicaid Other | Source: Ambulatory Visit | Attending: Orthopedic Surgery | Admitting: Orthopedic Surgery

## 2023-01-24 ENCOUNTER — Other Ambulatory Visit: Payer: Self-pay

## 2023-01-24 VITALS — BP 140/94 | HR 72 | Temp 98.4°F | Resp 14 | Ht 67.0 in | Wt 208.0 lb

## 2023-01-24 DIAGNOSIS — Z01812 Encounter for preprocedural laboratory examination: Secondary | ICD-10-CM | POA: Diagnosis present

## 2023-01-24 DIAGNOSIS — I2542 Coronary artery dissection: Secondary | ICD-10-CM | POA: Diagnosis not present

## 2023-01-24 HISTORY — DX: Gastro-esophageal reflux disease without esophagitis: K21.9

## 2023-01-24 HISTORY — DX: Acute myocardial infarction, unspecified: I21.9

## 2023-01-24 HISTORY — DX: Anxiety disorder, unspecified: F41.9

## 2023-01-24 HISTORY — DX: Depression, unspecified: F32.A

## 2023-01-24 HISTORY — DX: Cardiac murmur, unspecified: R01.1

## 2023-01-24 HISTORY — DX: Essential (primary) hypertension: I10

## 2023-01-24 LAB — CBC
HCT: 39.4 % (ref 36.0–46.0)
Hemoglobin: 13 g/dL (ref 12.0–15.0)
MCH: 29.7 pg (ref 26.0–34.0)
MCHC: 33 g/dL (ref 30.0–36.0)
MCV: 90 fL (ref 80.0–100.0)
Platelets: 335 10*3/uL (ref 150–400)
RBC: 4.38 MIL/uL (ref 3.87–5.11)
RDW: 12.6 % (ref 11.5–15.5)
WBC: 9.4 10*3/uL (ref 4.0–10.5)
nRBC: 0 % (ref 0.0–0.2)

## 2023-01-24 LAB — BASIC METABOLIC PANEL
Anion gap: 7 (ref 5–15)
BUN: 12 mg/dL (ref 6–20)
CO2: 24 mmol/L (ref 22–32)
Calcium: 8.6 mg/dL — ABNORMAL LOW (ref 8.9–10.3)
Chloride: 107 mmol/L (ref 98–111)
Creatinine, Ser: 0.43 mg/dL — ABNORMAL LOW (ref 0.44–1.00)
GFR, Estimated: >60 mL/min (ref >60)
Glucose, Bld: 78 mg/dL (ref 70–99)
Potassium: 4.1 mmol/L (ref 3.5–5.1)
Sodium: 138 mmol/L (ref 135–145)

## 2023-01-25 NOTE — Progress Notes (Signed)
Case: [id number] Date/Time: 02/01/23 0930   Procedures:      KNEE ARTHROSCOPY WITH ANTERIOR CRUCIATE LIGAMENT (ACL) RECONSTRUCTION WITH QUAD ALLOGRAFT (Left)     REPAIR OF LATERAL  MENISCUS (Left: Knee)   Anesthesia type: Choice   Pre-op diagnosis: Left knee anterior cruciate ligament tear, lateral meniscal tear   Location: [facility] ROOM 08 / [facility]   Surgeons: Yolonda Kida, MD       DISCUSSION: Kristi Chambers is a 42 yo female who presents to PAT prior to surgery above. PMH of former smoking, asthma, HLD, ADD, and coronary artery dissection (in 2018)  Patient follows with Cardiology for hx of coronary artery dissection in 2018. She presented to the ED for chest pain and "underwent a urgent LHC that showed scad mid-distal LAD with 40% stenosis and TIMI-3 flow. He underwent 2D echo that showed EF of 50-55% with distal anterior apical and apical hypokinesis 1 DD LV filling pressure. She was treated with GDMT and advised to discontinue Adderall."  She was last seen in clinic on 01/08/23. Echo was ordered due to new murmur heard on exam. Previous echo was normal. Cleared for surgery:  "Preop clearance: -Patient's RCRI score is 0.9% -The patient affirms she has been doing well without any new cardiac symptoms. They are able to achieve 7 METS without cardiac limitations. Therefore, based on ACC/AHA guidelines, the patient would be at acceptable risk for the planned procedure without further cardiovascular testing. The patient was advised that if she develops new symptoms prior to surgery to contact our office to arrange for a follow-up visit, and she verbalized understanding."  VS: BP (!) 140/94   Pulse 72   Temp 36.9 C (Oral)   Resp 14   Ht 5\' 7"  (1.702 m)   Wt 94.3 kg   SpO2 97%   BMI 32.58 kg/m   PROVIDERS: Lezlie Lye, Meda Coffee, MD   LABS: Labs reviewed: Acceptable for surgery. (all labs ordered are listed, but only abnormal results are displayed)  Labs Reviewed  BASIC  METABOLIC PANEL - Abnormal; Notable for the following components:      Result Value   Creatinine, Ser 0.43 (*)    Calcium 8.6 (*)    All other components within normal limits  CBC     IMAGES:   EKG:  NSR, rate 67   CV:  Echo 05/05/2017:  Study Conclusions   - Left ventricle: The cavity size was normal. Wall thickness was    normal. Systolic function was normal. The estimated ejection    fraction was in the range of 50% to 55%. Distal anteroapical and    apical hypokinesis. Doppler parameters are consistent with    abnormal left ventricular relaxation (grade 1 diastolic    dysfunction). The E/e&' ratio is between 8-15, suggesting    indeterminate LV filling pressure.  - Left atrium: The atrium was normal in size.  - Inferior vena cava: The vessel was normal in size. The    respirophasic diameter changes were in the normal range (>= 50%),    consistent with normal central venous pressure.   Impressions:   - LVEF 50-55%, normal wall thickness, distal anteroapical and    apical hypokinesis, grade 1 DD, indeterminate LV filling    pressure, normal LA size, normal IVC.   Left heart cath 05/03/2017  Prox LAD lesion is 10% stenosed. Mid LAD to Dist LAD lesion is 40% stenosed. 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 There is TIMI 3  flow. There is no aortic valve stenosis. The left ventricular systolic function is normal. LV end diastolic pressure is mildly elevated. The left ventricular ejection fraction is 50-55% by visual estimate.   Avoid anticoagulation.  Continue aspirin 81 mg daily.  Control BP aggressively.  Watch in ICU.   Past Medical History:  Diagnosis Date   Anxiety    Asthma    BMI 30.0-30.9,adult    Depression    GERD (gastroesophageal reflux disease)    Heart murmur    Hypertension    Myocardial infarction [facility])     Past Surgical History:  Procedure Laterality Date   LEFT HEART CATH AND CORONARY ANGIOGRAPHY N/A 05/03/2017    Procedure: LEFT HEART CATH AND CORONARY ANGIOGRAPHY;  Surgeon: Corky Crafts, MD;  Location: [facility] INVASIVE CV LAB;  Service: Cardiovascular;  Laterality: N/A;   SEPTOPLASTY  05/2021    MEDICATIONS:  albuterol (VENTOLIN HFA) 108 (90 Base) MCG/ACT inhaler   aspirin EC 81 MG EC tablet   atorvastatin (LIPITOR) 40 MG tablet   BREO ELLIPTA 100-25 MCG/ACT AEPB   carvedilol (COREG) 3.125 MG tablet   cetirizine (ZYRTEC) 10 MG tablet   diclofenac (VOLTAREN) 75 MG EC tablet   DULoxetine (CYMBALTA) 60 MG capsule   LAMICTAL 100 MG tablet   levonorgestrel (MIRENA) 20 MCG/24HR IUD   lisinopril (PRINIVIL,ZESTRIL) 10 MG tablet   montelukast (SINGULAIR) 10 MG tablet   nitroGLYCERIN (NITROSTAT) 0.4 MG SL tablet   omeprazole (PRILOSEC) 40 MG capsule   No current facility-administered medications for this encounter.   Marcille Blanco [facility] Short Stay/Anesthesiology [facility] Phone [phone] 01/25/2023 3:45 PM

## 2023-01-28 ENCOUNTER — Telehealth: Payer: Self-pay | Admitting: Cardiovascular Disease

## 2023-01-28 NOTE — Telephone Encounter (Signed)
Patient states that she called her insurance provider who states patient has been approved, but would need to be called by office on provider line to get the approval. She is requesting this be done asap.   [phone] [facility]

## 2023-01-28 NOTE — Telephone Encounter (Signed)
Pt called in stating she needs to have an echo done within a few days or they will cancel her procedure. She wants to know if her Berkley Harvey has been approved yet. Please advise.

## 2023-01-29 ENCOUNTER — Ambulatory Visit (HOSPITAL_COMMUNITY): Payer: Medicaid Other | Attending: Nurse Practitioner

## 2023-01-29 DIAGNOSIS — Z01810 Encounter for preprocedural cardiovascular examination: Secondary | ICD-10-CM | POA: Diagnosis present

## 2023-01-29 DIAGNOSIS — I2542 Coronary artery dissection: Secondary | ICD-10-CM | POA: Diagnosis present

## 2023-01-29 DIAGNOSIS — E785 Hyperlipidemia, unspecified: Secondary | ICD-10-CM | POA: Insufficient documentation

## 2023-01-29 DIAGNOSIS — R011 Cardiac murmur, unspecified: Secondary | ICD-10-CM | POA: Insufficient documentation

## 2023-01-29 DIAGNOSIS — Z8709 Personal history of other diseases of the respiratory system: Secondary | ICD-10-CM | POA: Diagnosis present

## 2023-01-29 LAB — ECHOCARDIOGRAM COMPLETE
Area-P 1/2: 3.87 cm2
S' Lateral: 3.2 cm

## 2023-01-29 MED ORDER — PERFLUTREN LIPID MICROSPHERE
1.0000 mL | INTRAVENOUS | Status: AC | PRN
Start: 2023-01-29 — End: 2023-01-29
  Administered 2023-01-29: 2 mL via INTRAVENOUS

## 2023-01-30 NOTE — Anesthesia Preprocedure Evaluation (Addendum)
Anesthesia Evaluation  Patient identified by MRN, date of birth, ID band Patient awake    Reviewed: Allergy & Precautions, H&P , NPO status , Patient's Chart, lab work & pertinent test results, reviewed documented beta blocker date and time   Airway Mallampati: II  TM Distance: >3 FB Neck ROM: Full    Dental no notable dental hx. (+) Teeth Intact, Dental Advisory Given   Pulmonary asthma , former smoker   Pulmonary exam normal breath sounds clear to auscultation       Cardiovascular hypertension, Pt. on medications and Pt. on home beta blockers + Past MI   Rhythm:Regular Rate:Normal     Neuro/Psych   Anxiety Depression    negative neurological ROS     GI/Hepatic Neg liver ROS,GERD  Medicated,,  Endo/Other  negative endocrine ROS    Renal/GU negative Renal ROS  negative genitourinary   Musculoskeletal   Abdominal   Peds  Hematology negative hematology ROS (+)   Anesthesia Other Findings   Reproductive/Obstetrics negative OB ROS                             Anesthesia Physical Anesthesia Plan  ASA: 2  Anesthesia Plan: General   Post-op Pain Management: Regional block* and Tylenol PO (pre-op)*   Induction: Intravenous  PONV Risk Score and Plan: 4 or greater and Ondansetron, Dexamethasone and Midazolam  Airway Management Planned: LMA  Additional Equipment:   Intra-op Plan:   Post-operative Plan: Extubation in OR  Informed Consent: I have reviewed the patients History and Physical, chart, labs and discussed the procedure including the risks, benefits and alternatives for the proposed anesthesia with the patient or authorized representative who has indicated his/her understanding and acceptance.     Dental advisory given  Plan Discussed with: CRNA  Anesthesia Plan Comments: (See PAT note from 9/19 by Sherlie Ban PA-C )        Anesthesia Quick Evaluation

## 2023-02-01 ENCOUNTER — Ambulatory Visit (HOSPITAL_COMMUNITY): Payer: Medicaid Other | Admitting: Anesthesiology

## 2023-02-01 ENCOUNTER — Encounter (HOSPITAL_COMMUNITY)
Admission: RE | Disposition: A | Discharge status: Home / Self Care | Payer: Self-pay | Source: Home / Self Care | Attending: Orthopedic Surgery

## 2023-02-01 ENCOUNTER — Ambulatory Visit (HOSPITAL_COMMUNITY): Payer: Medicaid Other | Admitting: Medical

## 2023-02-01 ENCOUNTER — Encounter (HOSPITAL_COMMUNITY): Payer: Self-pay | Admitting: Orthopedic Surgery

## 2023-02-01 ENCOUNTER — Other Ambulatory Visit: Payer: Self-pay

## 2023-02-01 ENCOUNTER — Ambulatory Visit (HOSPITAL_COMMUNITY)
Admission: RE | Admit: 2023-02-01 | Discharge: 2023-02-01 | Disposition: A | Discharge status: Home / Self Care | Payer: Medicaid Other | Attending: Orthopedic Surgery | Admitting: Orthopedic Surgery

## 2023-02-01 DIAGNOSIS — J45909 Unspecified asthma, uncomplicated: Secondary | ICD-10-CM | POA: Insufficient documentation

## 2023-02-01 DIAGNOSIS — F419 Anxiety disorder, unspecified: Secondary | ICD-10-CM | POA: Insufficient documentation

## 2023-02-01 DIAGNOSIS — S83512A Sprain of anterior cruciate ligament of left knee, initial encounter: Secondary | ICD-10-CM | POA: Diagnosis not present

## 2023-02-01 DIAGNOSIS — X58XXXA Exposure to other specified factors, initial encounter: Secondary | ICD-10-CM | POA: Insufficient documentation

## 2023-02-01 DIAGNOSIS — I252 Old myocardial infarction: Secondary | ICD-10-CM | POA: Insufficient documentation

## 2023-02-01 DIAGNOSIS — Z87891 Personal history of nicotine dependence: Secondary | ICD-10-CM | POA: Diagnosis not present

## 2023-02-01 DIAGNOSIS — S83282A Other tear of lateral meniscus, current injury, left knee, initial encounter: Secondary | ICD-10-CM | POA: Diagnosis not present

## 2023-02-01 DIAGNOSIS — I10 Essential (primary) hypertension: Secondary | ICD-10-CM | POA: Diagnosis not present

## 2023-02-01 DIAGNOSIS — F32A Depression, unspecified: Secondary | ICD-10-CM | POA: Diagnosis not present

## 2023-02-01 DIAGNOSIS — K219 Gastro-esophageal reflux disease without esophagitis: Secondary | ICD-10-CM | POA: Diagnosis not present

## 2023-02-01 HISTORY — PX: MENISCUS REPAIR: SHX5179

## 2023-02-01 LAB — POCT PREGNANCY, URINE: Preg Test, Ur: NEGATIVE

## 2023-02-01 SURGERY — KNEE ARTHROSCOPY WITH ANTERIOR CRUCIATE LIGAMENT (ACL) RECONSTRUCTION WITH HAMSTRING GRAFT
Anesthesia: General | Site: Knee | Laterality: Left

## 2023-02-01 MED ORDER — CEFAZOLIN SODIUM-DEXTROSE 2-4 GM/100ML-% IV SOLN
2.0000 g | INTRAVENOUS | Status: AC
  Administered 2023-02-01: 2 g via INTRAVENOUS
  Filled 2023-02-01: qty 100

## 2023-02-01 MED ORDER — KETOROLAC TROMETHAMINE 30 MG/ML IJ SOLN
INTRAMUSCULAR | Status: AC
  Filled 2023-02-01: qty 1

## 2023-02-01 MED ORDER — DEXAMETHASONE SODIUM PHOSPHATE 10 MG/ML IJ SOLN
INTRAMUSCULAR | Status: AC
  Filled 2023-02-01: qty 1

## 2023-02-01 MED ORDER — ACETAMINOPHEN 500 MG PO TABS
1000.0000 mg | ORAL_TABLET | Freq: Once | ORAL | Status: AC
  Administered 2023-02-01: 1000 mg via ORAL
  Filled 2023-02-01: qty 2

## 2023-02-01 MED ORDER — MIDAZOLAM HCL 5 MG/5ML IJ SOLN
INTRAMUSCULAR | Status: DC | PRN
  Administered 2023-02-01: 2 mg via INTRAVENOUS

## 2023-02-01 MED ORDER — METHOCARBAMOL 750 MG PO TABS: 750.0000 mg | ORAL_TABLET | Freq: Four times a day (QID) | ORAL | 0 refills | Status: DC | PRN

## 2023-02-01 MED ORDER — PROPOFOL 10 MG/ML IV BOLUS
INTRAVENOUS | Status: AC
  Filled 2023-02-01: qty 20

## 2023-02-01 MED ORDER — ONDANSETRON HCL 4 MG/2ML IJ SOLN
INTRAMUSCULAR | Status: AC
  Filled 2023-02-01: qty 2

## 2023-02-01 MED ORDER — HYDROMORPHONE HCL 1 MG/ML IJ SOLN
0.2500 mg | INTRAMUSCULAR | Status: DC | PRN
  Administered 2023-02-01 (×3): 0.5 mg via INTRAVENOUS

## 2023-02-01 MED ORDER — CHLORHEXIDINE GLUCONATE 0.12 % MT SOLN
15.0000 mL | Freq: Once | OROMUCOSAL | Status: AC
  Administered 2023-02-01: 15 mL via OROMUCOSAL

## 2023-02-01 MED ORDER — KETOROLAC TROMETHAMINE 30 MG/ML IJ SOLN
INTRAMUSCULAR | Status: DC | PRN
Start: 2023-02-01 — End: 2023-02-01
  Administered 2023-02-01: 30 mg via INTRAVENOUS

## 2023-02-01 MED ORDER — HYDROMORPHONE HCL 1 MG/ML IJ SOLN
INTRAMUSCULAR | Status: AC
  Administered 2023-02-01: 0.5 mg via INTRAVENOUS
  Filled 2023-02-01: qty 2

## 2023-02-01 MED ORDER — FENTANYL CITRATE PF 50 MCG/ML IJ SOSY
100.0000 ug | PREFILLED_SYRINGE | Freq: Once | INTRAMUSCULAR | Status: AC
  Administered 2023-02-01: 100 ug via INTRAVENOUS
  Filled 2023-02-01: qty 2

## 2023-02-01 MED ORDER — EPINEPHRINE PF 1 MG/ML IJ SOLN
INTRAMUSCULAR | Status: AC
  Filled 2023-02-01: qty 1

## 2023-02-01 MED ORDER — ORAL CARE MOUTH RINSE: 15.0000 mL | Freq: Once | OROMUCOSAL | Status: AC

## 2023-02-01 MED ORDER — MIDAZOLAM HCL 2 MG/2ML IJ SOLN
2.0000 mg | Freq: Once | INTRAMUSCULAR | Status: AC
  Administered 2023-02-01: 2 mg via INTRAVENOUS
  Filled 2023-02-01: qty 2

## 2023-02-01 MED ORDER — OXYCODONE HCL 5 MG PO TABS
ORAL_TABLET | ORAL | Status: AC
  Administered 2023-02-01: 5 mg via ORAL
  Filled 2023-02-01: qty 1

## 2023-02-01 MED ORDER — MIDAZOLAM HCL 2 MG/2ML IJ SOLN
INTRAMUSCULAR | Status: AC
  Filled 2023-02-01: qty 2

## 2023-02-01 MED ORDER — BUPIVACAINE-EPINEPHRINE (PF) 0.5% -1:200000 IJ SOLN
INTRAMUSCULAR | Status: DC | PRN
  Administered 2023-02-01: 20 mL via PERINEURAL

## 2023-02-01 MED ORDER — SODIUM CHLORIDE (PF) 0.9 % IJ SOLN
INTRAMUSCULAR | Status: DC | PRN
  Administered 2023-02-01: 1 mL

## 2023-02-01 MED ORDER — FENTANYL CITRATE (PF) 250 MCG/5ML IJ SOLN
INTRAMUSCULAR | Status: AC
  Filled 2023-02-01: qty 5

## 2023-02-01 MED ORDER — FENTANYL CITRATE (PF) 100 MCG/2ML IJ SOLN
INTRAMUSCULAR | Status: DC | PRN
  Administered 2023-02-01: 100 ug via INTRAVENOUS
  Administered 2023-02-01 (×3): 50 ug via INTRAVENOUS

## 2023-02-01 MED ORDER — LACTATED RINGERS IV SOLN: INTRAVENOUS | Status: DC

## 2023-02-01 MED ORDER — ONDANSETRON HCL 4 MG/2ML IJ SOLN
INTRAMUSCULAR | Status: DC | PRN
  Administered 2023-02-01: 4 mg via INTRAVENOUS

## 2023-02-01 MED ORDER — OXYCODONE HCL 5 MG PO TABS
5.0000 mg | ORAL_TABLET | ORAL | 0 refills | Status: DC | PRN
Start: 2023-02-01 — End: 2023-09-12

## 2023-02-01 MED ORDER — ONDANSETRON HCL 4 MG PO TABS: 4.0000 mg | ORAL_TABLET | Freq: Three times a day (TID) | ORAL | 0 refills | Status: DC | PRN

## 2023-02-01 MED ORDER — PROPOFOL 10 MG/ML IV BOLUS
INTRAVENOUS | Status: DC | PRN
Start: 2023-02-01 — End: 2023-02-01
  Administered 2023-02-01: 200 mg via INTRAVENOUS

## 2023-02-01 MED ORDER — OXYCODONE HCL 5 MG PO TABS: 5.0000 mg | ORAL_TABLET | Freq: Once | ORAL | Status: AC | PRN

## 2023-02-01 MED ORDER — DEXAMETHASONE SODIUM PHOSPHATE 4 MG/ML IJ SOLN
INTRAMUSCULAR | Status: DC | PRN
Start: 2023-02-01 — End: 2023-02-01
  Administered 2023-02-01: 4 mg via INTRAVENOUS

## 2023-02-01 MED ORDER — SODIUM CHLORIDE 0.9 % IR SOLN
Status: DC | PRN
  Administered 2023-02-01: 12000 mL

## 2023-02-01 SURGICAL SUPPLY — 49 items
ANCHOR BUTTON TIGHTROPE 14 (Anchor) IMPLANT
BANDAGE ESMARK 6X9 LF (GAUZE/BANDAGES/DRESSINGS) IMPLANT
BLADE SHAVER TORPEDO 4X13 (MISCELLANEOUS) ×2 IMPLANT
BNDG CMPR 6 X 5 YARDS HK CLSR (GAUZE/BANDAGES/DRESSINGS) ×2
BNDG CMPR 9X6 STRL LF SNTH (GAUZE/BANDAGES/DRESSINGS)
BNDG ELASTIC 6INX 5YD STR LF (GAUZE/BANDAGES/DRESSINGS) ×2 IMPLANT
BNDG ESMARK 6X9 LF (GAUZE/BANDAGES/DRESSINGS)
COOLER ICEMAN CLASSIC (MISCELLANEOUS) IMPLANT
COVER SURGICAL LIGHT HANDLE (MISCELLANEOUS) IMPLANT
CUFF TOURN SGL QUICK 34 (TOURNIQUET CUFF) ×2
CUFF TRNQT CYL 34X4.125X (TOURNIQUET CUFF) ×2 IMPLANT
CUTTER BONE 4.0MM X 13CM (MISCELLANEOUS) IMPLANT
DRAPE ARTHROSCOPY W/POUCH 114 (DRAPES) ×2 IMPLANT
DRAPE U-SHAPE 47X51 STRL (DRAPES) ×2 IMPLANT
DURAPREP 26ML APPLICATOR (WOUND CARE) ×2 IMPLANT
GAUZE 4X4 16PLY ~~LOC~~+RFID DBL (SPONGE) ×2 IMPLANT
GAUZE PAD ABD 8X10 STRL (GAUZE/BANDAGES/DRESSINGS) IMPLANT
GAUZE SPONGE 4X4 12PLY STRL (GAUZE/BANDAGES/DRESSINGS) ×2 IMPLANT
GAUZE XEROFORM 1X8 LF (GAUZE/BANDAGES/DRESSINGS) ×2 IMPLANT
GEL BONE GRAFT DBM ALLOSYNC 5 (Bone Implant) IMPLANT
GLOVE BIO SURGEON STRL SZ7.5 (GLOVE) ×4 IMPLANT
GLOVE BIOGEL PI IND STRL 8 (GLOVE) ×4 IMPLANT
GOWN STRL REUS W/ TWL XL LVL3 (GOWN DISPOSABLE) ×4 IMPLANT
GOWN STRL REUS W/TWL XL LVL3 (GOWN DISPOSABLE) ×4
GRAFT TISS 60-80 FRZN TENDON (Tissue) IMPLANT
IMP SYS 2ND FIX PEEK 4.75X19.1 (Miscellaneous) ×2 IMPLANT
IMPL FIBERSTITCH 1.5X24 CVD (Anchor) IMPLANT
IMPL SYS 2ND FX PEEK 4.75X19.1 (Miscellaneous) IMPLANT
IMPLANT FIBERSTITCH 1.5X24 CVD (Anchor) ×4 IMPLANT
IV NS IRRIG 3000ML ARTHROMATIC (IV SOLUTION) ×4 IMPLANT
KIT BASIN OR (CUSTOM PROCEDURE TRAY) ×2 IMPLANT
KIT BIOCARTILAGE LG JOINT MIX (KITS) IMPLANT
MANIFOLD NEPTUNE II (INSTRUMENTS) ×2 IMPLANT
PACK ARTHROSCOPY DSU (CUSTOM PROCEDURE TRAY) ×2 IMPLANT
PAD ARMBOARD 7.5X6 YLW CONV (MISCELLANEOUS) IMPLANT
PAD COLD SHLDR WRAP-ON (PAD) IMPLANT
PADDING CAST COTTON 6X4 STRL (CAST SUPPLIES) IMPLANT
STRIP CLOSURE SKIN 1/4X4 (GAUZE/BANDAGES/DRESSINGS) IMPLANT
SUT ETHILON 4 0 PS 2 18 (SUTURE) ×2 IMPLANT
SYR CONTROL 10ML LL (SYRINGE) IMPLANT
SYS ALLOGRAFT GRAFTLINK CP2 (Anchor) ×2 IMPLANT
SYSTEM ALLOGRAFT GRAFTLINK CP2 (Anchor) IMPLANT
TISSUE GRAFTLINK FGL (Tissue) ×2 IMPLANT
TOWEL OR 17X26 10 PK STRL BLUE (TOWEL DISPOSABLE) ×2 IMPLANT
TUBING ARTHROSCOPY IRRIG 16FT (MISCELLANEOUS) ×2 IMPLANT
TUBING CONNECTING 10 (TUBING) ×2 IMPLANT
WAND ABLATOR APOLLO I90 (BUR) IMPLANT
WAND APOLLORF SJ50 AR-9845 (SURGICAL WAND) IMPLANT
WATER STERILE IRR 500ML POUR (IV SOLUTION) ×2 IMPLANT

## 2023-02-01 NOTE — Transfer of Care (Signed)
Immediate Anesthesia Transfer of Care Note  Patient: Kristi Chambers  Procedure(s) Performed: KNEE ARTHROSCOPY WITH ANTERIOR CRUCIATE LIGAMENT (ACL) RECONSTRUCTION WITH QUAD ALLOGRAFT (Left) REPAIR OF LATERAL  MENISCUS (Left: Knee)  Patient Location: PACU  Anesthesia Type:General and Regional  Level of Consciousness: oriented and drowsy  Airway & Oxygen Therapy: Patient Spontanous Breathing and Patient connected to face mask oxygen  Post-op Assessment: Report given to RN and Post -op Vital signs reviewed and stable  Post vital signs: Reviewed and stable  Last Vitals:  Vitals Value Taken Time  BP 149/81 02/01/23 1119  Temp    Pulse 101 02/01/23 1122  Resp 13 02/01/23 1122  SpO2 99 % 02/01/23 1122  Vitals shown include unfiled device data.  Last Pain:  Vitals:   02/01/23 0855  PainSc: 0-No pain         Complications: No notable events documented.

## 2023-02-01 NOTE — Anesthesia Procedure Notes (Signed)
Procedure Name: LMA Insertion Date/Time: 02/01/2023 9:45 AM  Performed by: Ahmed Prima, CRNAPre-anesthesia Checklist: Patient identified, Emergency Drugs available, Suction available and Patient being monitored Patient Re-evaluated:Patient Re-evaluated prior to induction Oxygen Delivery Method: Circle System Utilized Preoxygenation: Pre-oxygenation with 100% oxygen Induction Type: IV induction Ventilation: Mask ventilation without difficulty and Oral airway inserted - appropriate to patient size LMA: LMA inserted LMA Size: 4.0 Number of attempts: 1 Placement Confirmation: positive ETCO2 Tube secured with: Tape Dental Injury: Teeth and Oropharynx as per pre-operative assessment

## 2023-02-01 NOTE — Anesthesia Postprocedure Evaluation (Signed)
Anesthesia Post Note  Patient: Kristi Chambers  Procedure(s) Performed: KNEE ARTHROSCOPY WITH ANTERIOR CRUCIATE LIGAMENT (ACL) RECONSTRUCTION WITH QUAD ALLOGRAFT (Left) REPAIR OF LATERAL  MENISCUS (Left: Knee)     Patient location during evaluation: PACU Anesthesia Type: General and Regional Level of consciousness: awake and alert Pain management: pain level controlled Vital Signs Assessment: post-procedure vital signs reviewed and stable Respiratory status: spontaneous breathing, nonlabored ventilation and respiratory function stable Cardiovascular status: blood pressure returned to baseline and stable Postop Assessment: no apparent nausea or vomiting Anesthetic complications: no  No notable events documented.  Last Vitals:  Vitals:   02/01/23 1200 02/01/23 1215  BP: (!) 152/85 (!) 140/87  Pulse: 100 97  Resp: 15 14  Temp:    SpO2: 96% 95%    Last Pain:  Vitals:   02/01/23 1202  PainSc: 5                  Kawika Bischoff,W. EDMOND

## 2023-02-01 NOTE — H&P (Signed)
ORTHOPAEDIC H&P REQUESTING PHYSICIAN: Yolonda Kida, MD  PCP:  Lezlie Lye, Meda Coffee, MD  Chief Complaint: Left knee pain and instability  HPI: Kristi Chambers is a 42 y.o. female who complains of left knee pain and instability now for few months.  She has had MRI that did confirm a complete medial meniscus radial type tear as well as anterior ligament disruption.  Here today for arthroscopic assisted ACL reconstruction possible medial meniscus repair.  No new complaints at this time.  Past Medical History:  Diagnosis Date   Anxiety    Asthma    BMI 30.0-30.9,adult    Depression    GERD (gastroesophageal reflux disease)    Heart murmur    Hypertension    Myocardial infarction [facility])    Past Surgical History:  Procedure Laterality Date   LEFT HEART CATH AND CORONARY ANGIOGRAPHY N/A 05/03/2017   Procedure: LEFT HEART CATH AND CORONARY ANGIOGRAPHY;  Surgeon: Corky Crafts, MD;  Location: [facility] INVASIVE CV LAB;  Service: Cardiovascular;  Laterality: N/A;   SEPTOPLASTY  05/2021   Social History   Socioeconomic History   Marital status: Single    Spouse name: Not on file   Number of children: Not on file   Years of education: Not on file   Highest education level: Not on file  Occupational History   Not on file  Tobacco Use   Smoking status: Former    Current packs/day: 0.50    Types: Cigarettes    Passive exposure: Current   Smokeless tobacco: Never  Vaping Use   Vaping status: Never Used  Substance and Sexual Activity   Alcohol use: Yes    Comment: occ   Drug use: No   Sexual activity: Yes  Other Topics Concern   Not on file  Social History Narrative   Not on file   Social Determinants of Health   Financial Resource Strain: Low Risk  (02/03/2021)   Received from [facility], [facility]   Overall Financial Resource Strain (CARDIA)    Difficulty of Paying Living Expenses: Not hard at all  Food Insecurity: No Food Insecurity (02/03/2021)    Received from [facility], [facility]   Hunger Vital Sign    Worried About Running Out of Food in the Last Year: Never true    Ran Out of Food in the Last Year: Never true  Transportation Needs: No Transportation Needs (02/03/2021)   Received from [employer], [facility]   PRAPARE - Transportation    Lack of Transportation (Medical): No    Lack of Transportation (Non-Medical): No  Physical Activity: Insufficiently Active (02/03/2021)   Received from [facility], [facility]   Exercise Vital Sign    Days of Exercise per Week: 2 days    Minutes of Exercise per Session: 30 min  Stress: Stress Concern Present (02/03/2021)   Received from [facility], [facility] - Occupational Stress Questionnaire    Feeling of Stress : To some extent  Social Connections: Unknown (09/04/2021)   Received from [facility], [facility]   Social Network    Social Network: Not on file   Family History  Problem Relation Age of Onset   High blood pressure Mother    Heart disease Father    High blood pressure Father    Heart attack Father    Stroke Father        cabg   Healthy Son  Breast cancer Neg Hx    No Known Allergies Prior to Admission medications   Medication Sig Start Date End Date Taking? Authorizing Provider  albuterol (VENTOLIN HFA) 108 (90 Base) MCG/ACT inhaler Inhale 2 puffs into the lungs every 6 (six) hours as needed for wheezing or shortness of breath.   Yes [provider]  aspirin EC 81 MG EC tablet Take 1 tablet (81 mg total) by mouth daily. 05/06/17  Yes Kilroy, Luke K, PA-C  atorvastatin (LIPITOR) 40 MG tablet Take 40 mg by mouth daily. 12/10/17  Yes [provider]  BREO ELLIPTA 100-25 MCG/ACT AEPB Inhale 1 puff into the lungs daily. 08/27/22  Yes [provider]  carvedilol (COREG) 3.125 MG tablet Take 1 tablet (3.125 mg total) by mouth 2 (two) times daily with a meal. 09/28/20  Yes Wendall Stade, MD  cetirizine (ZYRTEC) 10 MG tablet Take 10 mg by mouth at bedtime.   Yes [provider]  diclofenac (VOLTAREN) 75 MG EC tablet Take 1 tablet by mouth 2 (two) times daily with a meal. 12/20/22  Yes [provider]  DULoxetine (CYMBALTA) 60 MG capsule Take 60 mg by mouth daily.   Yes [provider]  LAMICTAL 100 MG tablet Take 100 mg by mouth at bedtime.   Yes [provider]  levonorgestrel (MIRENA) 20 MCG/24HR IUD 1 each by Intrauterine route once. Implanted October 2017   Yes [provider]  lisinopril (PRINIVIL,ZESTRIL) 10 MG tablet Take 10 mg by mouth daily. 05/29/18  Yes [provider]  montelukast (SINGULAIR) 10 MG tablet Take 10 mg by mouth at bedtime. 08/08/22  Yes [provider]  nitroGLYCERIN (NITROSTAT) 0.4 MG SL tablet DISSOLVE ONE TABLET UNDER THE TONGUE EVERY 5 MINUTES AS NEEDED FOR CHEST PAIN.  DO NOT EXCEED A TOTAL OF 3 DOSES IN 15 MINUTES 06/04/22  Yes Wendall Stade, MD  omeprazole (PRILOSEC) 40 MG capsule Take 40 mg by mouth at bedtime.   Yes [provider]   No results found.  Positive ROS: All other systems have been reviewed and were otherwise negative with the exception of those mentioned in the HPI and as above.  Physical Exam: General: Alert, no acute distress Cardiovascular: No pedal edema Respiratory: No cyanosis, no use of accessory musculature GI: No organomegaly, abdomen is soft and non-tender Skin: No lesions in the area of chief complaint Neurologic: Sensation intact distally Psychiatric: Patient is competent for consent with normal mood and affect Lymphatic: No axillary or cervical lymphadenopathy  MUSCULOSKELETAL: Left lower extremity is warm and well-perfused with no open wounds or lesions.  She is neurovascular intact throughout.  Assessment: 1.  Left knee anterior ligament disruption. 2.  Left knee acute medial meniscus tear, initial encounter.  Plan: -[name] and I  again reviewed her pathology.  We discussed the recommendation to proceed today with arthroscopic assisted ACL reconstruction with transosseous medial meniscus root repair as a possibility also.  She has provided informed consent.  We discussed the risk of bleeding, infection, damage to surrounding nerves and vessels, stiffness, failure of graft, need for revision surgery, development of arthritis, the risk of anesthesia as well as the risk of DVT.  She has provided informed consent.  Plan for discharge home postop from PACU.    Yolonda Kida, MD Cell [phone]    02/01/2023 7:13 AM

## 2023-02-01 NOTE — Op Note (Addendum)
Surgery Date: 02/01/2023     Surgeon(s): Kristi Kida, MD   ASSIST: Dion Saucier, PA-C     Assistant attestation:   PA Mcclung present for the entire procedure.   Implants:  Arthrex all inside cortical buttons on femur and tibia 4.75 PEEK swivel lock x 1. Graft link hamstring allograft for ACL reconstruction Arthrex 1.3 mm 24 degree fiber stitch meniscus repair device x 2   ANESTHESIA:  general, and adductor block   IV FLUIDS AND URINE: See anesthesia.   TOURNIQUET: 50 minutes   DRAINS: none   COMPLICATIONS: None.     ESTIMATED BLOOD LOSS: minimal   PREOPERATIVE DIAGNOSES:  1.  Left knee medial meniscus tear 2.  Left knee complete ACL rupture   POSTOPERATIVE DIAGNOSES:  1.  Left knee lateral  meniscus tear, initial encounter 2.  Left knee complete ACL rupture   PROCEDURES PERFORMED:  1.  Left knee arthroscopy with Hamstring allograft ACL reconstruction 2.  Left knee arthroscopic lateral meniscus repair   DESCRIPTION OF PROCEDURE: Kristi Chambers is a pleasant 42 year old female with left knee Lateral meniscus tear, Complete ACL tear with previously done allograft rupture, partial MCL tear, lateral meniscus tear.  They sustained these injuries months prior to coming to the operating room today.  After a short period of prehabilitation to allow for return of ROM and quadriceps strenght, we discussed proceeding with arthroscopically assisted hamstring allograft ACL reconstruction and lateral meniscectomy versus repair.  We reviewed the risks benefits and indications of this procedure including but not limited to bleeding, infection, damage to neurovascular structures, need for future surgery, developed an of arthrosis, rupture of graft, continued instability of the knee, and developement of blood clots and risk of anesthesia.  All questions answered.   The patient was identified in the preoperative holding area and the operative extremity was marked. The  patient was brought to the operating room and transferred to operating table in a supine position. Satisfactory general anesthesia was induced by anesthesiology.     Examination under anesthesia revealed a grade 2B Lachman, grade 2 pivot shift, and stable to varus and valgus stress.      At the back table, we next prepared the graft.  We utilized the all inside ABS allograft technique.  The graft was pretensioned on the back table and wrapped in a saline-soaked gauze.  The graft measured 71 mm in total length, 8.0 mm on femoral tunnel diameter, and 8.5 mm diameter on the tibial tunnel.   Standard anterolateral, anteromedial arthroscopy portals were obtained. The anteromedial portal was obtained with a spinal needle for localization under direct visualization with subsequent diagnostic findings.    Anteromedial and anterolateral chambers: mild synovitis. The synovitis was debrided with a 4.5 mm full radius shaver through both the anteromedial and lateral portals.    Suprapatellar pouch and gutters: no synovitis or debris. Patella chondral surface: Grade 0 Trochlear chondral surface: Grade 0 Patellofemoral tracking: Midline, no tilt Medial meniscus: Intact, no tear.  Medial femoral condyle flexion bearing surface: Grade 0 Medial femoral condyle extension bearing surface: Grade 0 Medial tibial plateau: Grade 0 Anterior cruciate ligament:Complete mid substance tear Posterior cruciate ligament:stable Lateral meniscus: Oblique tear posterior horn that was completely from the free edge back to the red zone of the capsule.  There was an approximate 2 cm stump at the posterior root which was intact. Lateral femoral condyle flexion bearing surface: Grade 0 Lateral femoral condyle extension bearing surface: Grade 0  Lateral tibial plateau: Grade 1  We began next, with the lateral meniscus repair.  The interface between the torn surfaces was prepared biologically with spinal needle as well as motorized  shaver to elicit good petechial bleeding.  We then utilized a all inside technique with the 24 degree Arthrex fiber stitch meniscus repair device.  We placed 2 separate horizontal mattresses in an oblique fashion such that they were performing a X pattern across the zone of injury.  This created nice compression across the injury site.   Next, the ACL reconstruction was undertaken. The ACL stump was removed with thermal ablation and shaver and anatomic bony landmarks were marked for the placement of the femoral and tibial sockets.  Arthrex retroguides and Flipcutters were used to create the sockets and perform the procedure by an all-inside GraftLink technique.  The femoral socket was created at the inferior portion of the bifurcate ridge of the lateral femoral wall with a size 7.5 mm FlipCutter to a depth of  15 mm while the tibial socket was created at the center of the ACL footprint from front to back and toward the base of the medial tibial eminence from medial to lateral, to a depth of 23-25 mm with a 8.0 mm FlipCutter. Bony debris was removed and the edges of socket apertures were smoothed. Suture shuttles were used to deliver the graft into the femoral socket first and the tibial socket second. The graft was then secured within the sockets, cinching the self-locking sutures overtop of the proximal and distal cortical buttons with the knee in a reduced position maintained at 20 degrees flexion while a moderate force posterior drawer was applied.  After this preliminary tensioning, the knee was placed through several flexion-extension cycles to eliminate any graft settling or excursion and the graft was re-tensioned in the same manner and the sutures were tied over top of the buttons proximally and distally, and the four tibial sided suture arms were secondarily secured at the proximal tibia with 1 SwiveLock anchor. Of note we did also pass a free labral tape through the ACL fixation as an separate internal  brace backup fixation.   Final images of the ACL graft were obtained, revealing no lateral wall or roof impingement of the graft at the notch through range of motion.  Stability of the ACL graft was assessed and found to be normalized at grade 0 lachman and grade 0 pivot shift.    The wounds were all closed in layers per usual.  Dressings were applied and a brace placed And locked in 0 of flexion..  There were no apparent complications.  The patient was awakened and taken to recovery room in satisfactory condition.   POSTOPERATIVE PLAN:  Kristi Chambers will be touch down weight bearing on crutches for 1 month.  They can begin range of motion of the knee to 90 degrees over the course of the next 1 month as well.  We will then progress range of motion and weightbearing after the 1 month mark and progress along at that juncture with a standard ACL protocol.  They will be on 81 mg asa daily for 6 weeks for DVT PPX.  they will return to the clinic to see the surgeon in 2 weeks.   Kristi Chambers

## 2023-02-01 NOTE — Progress Notes (Signed)
Orthopedic Tech Progress Note Patient Details:  Kristi Chambers 1980/08/02 [id number] Bledsoe brace has been delivered to the OR desk.  Patient ID: Kristi Chambers, female   DOB: 05-Apr-1981, 42 y.o.   MRN: [id number]  [name] 02/01/2023, 9:56 AM

## 2023-02-01 NOTE — Brief Op Note (Signed)
02/01/2023  11:10 AM  PATIENT:  Kristi Chambers  42 y.o. female  PRE-OPERATIVE DIAGNOSIS:  Left knee anterior cruciate ligament tear, lateral meniscal tear  POST-OPERATIVE DIAGNOSIS:  Left knee anterior cruciate ligament tear, lateral meniscal tear  PROCEDURE:  Procedure(s): KNEE ARTHROSCOPY WITH ANTERIOR CRUCIATE LIGAMENT (ACL) RECONSTRUCTION WITH QUAD ALLOGRAFT (Left) REPAIR OF LATERAL  MENISCUS (Left)  SURGEON:  Surgeons and Role:    * Yolonda Kida, MD - Primary  PHYSICIAN ASSISTANT: Dion Saucier, PA-C  ANESTHESIA:   regional and general  EBL:  10 mL   BLOOD ADMINISTERED:none  DRAINS: none   LOCAL MEDICATIONS USED:  NONE  SPECIMEN:  No Specimen  DISPOSITION OF SPECIMEN:  N/A  COUNTS:  YES  TOURNIQUET:   Total Tourniquet Time Documented: Thigh (Left) - 69 minutes Total: Thigh (Left) - 69 minutes   DICTATION: .Note written in EPIC  PLAN OF CARE: Discharge to home after PACU  PATIENT DISPOSITION:  PACU - hemodynamically stable.   Delay start of Pharmacological VTE agent (>24hrs) due to surgical blood loss or risk of bleeding: not applicable

## 2023-02-01 NOTE — Anesthesia Procedure Notes (Signed)
Anesthesia Regional Block: Adductor canal block   Pre-Anesthetic Checklist: , timeout performed,  Correct Patient, Correct Site, Correct Laterality,  Correct Procedure, Correct Position, site marked,  Risks and benefits discussed,  Pre-op evaluation,  At surgeon's request and post-op pain management  Laterality: Left  Prep: Maximum Sterile Barrier Precautions used, chloraprep       Needles:  Injection technique: Single-shot  Needle Type: Echogenic Stimulator Needle     Needle Length: 9cm  Needle Gauge: 21     Additional Needles:   Procedures:,,,, ultrasound used (permanent image in chart),,    Narrative:  Start time: 02/01/2023 8:24 AM End time: 02/01/2023 8:34 AM Injection made incrementally with aspirations every 5 mL.  Performed by: Personally  Anesthesiologist: Gaynelle Adu, MD

## 2023-02-01 NOTE — Discharge Instructions (Addendum)
DISCHARGE INSTRUCTIONS: ________________________________________________________________________________ ACL RECONSTRUCTION HOME EXERCISE PROGRAM (0-2 WEEKS)   Elevate the leg above your heart as often as possible. Touchdown weightbearing with the Bledsoe brace, use crutches. You should sleep in the knee brace with it locked in full extension.  You may remove for showering.  Otherwise you may remove for exercise. Start normal showering on postoperative day #3.  Do not submerge underwater Goals for first 4 weeks:  minimal swelling, motion 0-90,   Use pain medication as needed.  You may also take Tylenol and Advil around-the-clock in alternating fashion in addition to the pain medication.  To prevent constipation use Colace 100mg . twice a day while on pain medication.  If constipated, use Miralax 17 gm once a day and drink plenty of fluids.  These medications can be obtained at the pharmacy without a prescription.   Follow up in the office in 14 days. You may remove your postoperative bandages on the third day from surgery and begin showering.  Do not remove the Steri-Strips.  Do not submerge underwater.  Replace your Ace bandage over your wounds before reapplying your knee brace You should also continue to wear the TED hose for 2 weeks postoperatively. You should also take an 81 mg aspirin twice per day x 6 weeks for the prevention of DVT.

## 2023-02-02 ENCOUNTER — Encounter (HOSPITAL_COMMUNITY): Payer: Self-pay | Admitting: Orthopedic Surgery

## 2023-02-11 ENCOUNTER — Other Ambulatory Visit (HOSPITAL_COMMUNITY): Payer: Medicaid Other

## 2023-07-05 ENCOUNTER — Other Ambulatory Visit: Payer: Self-pay | Admitting: Cardiovascular Disease

## 2023-09-02 NOTE — Progress Notes (Signed)
 Office Visit Note  Patient: Kristi Chambers             Date of Birth: 1980-12-13           MRN: [id number]             PCP: Elyce Hams, Marguerita Shih, MD Referring: Ladean Picket, NP Visit Date: 09/12/2023 Occupation: @GUAROCC @  Subjective:  Positive ANA  History of Present Illness: Kristi Chambers is a 43 y.o. female with positive ANA and fatigue returns for a follow-up visit after her last visit almost a year ago.  She states that she has been doing well without any history of oral ulcers, nasal ulcers, sicca symptoms, malar rash, photosensitivity, inflammatory arthritis, Raynaud's or lymphadenopathy.  She continues to have fatigue.  She denies any insomnia.  She works long hours does heavy lifting at work.  She denies any joint pain today.    Activities of Daily Living:  Patient reports morning stiffness for 20-30 minutes.   Patient Denies nocturnal pain.  Difficulty dressing/grooming: Denies Difficulty climbing stairs: Denies Difficulty getting out of chair: Denies Difficulty using hands for taps, buttons, cutlery, and/or writing: Denies  Review of Systems  Constitutional:  Positive for fatigue.  HENT:  Negative for mouth sores and mouth dryness.   Eyes:  Negative for dryness.  Respiratory:  Negative for shortness of breath.   Cardiovascular:  Negative for chest pain and palpitations.  Gastrointestinal:  Negative for blood in stool, constipation and diarrhea.  Endocrine: Negative for increased urination.  Genitourinary:  Negative for involuntary urination.  Musculoskeletal:  Positive for morning stiffness. Negative for joint pain, gait problem, joint pain, joint swelling, myalgias, muscle weakness, muscle tenderness and myalgias.  Skin:  Negative for color change, rash, hair loss and sensitivity to sunlight.  Allergic/Immunologic: Negative for susceptible to infections.  Neurological:  Negative for dizziness and headaches.  Hematological:  Negative for swollen glands.   Psychiatric/Behavioral:  Negative for depressed mood and sleep disturbance. The patient is not nervous/anxious.     PMFS History:  Patient Active Problem List   Diagnosis Date Noted   BMI 30.0-30.9,adult    Dyslipidemia 05/05/2017   ADD (attention deficit disorder) 05/05/2017   History of asthma 05/05/2017   Chest pain 05/03/2017   STEMI (ST elevation myocardial infarction) [facility])    Coronary artery dissection     Past Medical History:  Diagnosis Date   Anxiety    Asthma    BMI 30.0-30.9,adult    Depression    GERD (gastroesophageal reflux disease)    Heart murmur    Hypertension    Myocardial infarction (HCC)     Family History  Problem Relation Age of Onset   High blood pressure Mother    Heart disease Father    High blood pressure Father    Heart attack Father    Stroke Father        cabg   Healthy Son    Breast cancer Neg Hx    Past Surgical History:  Procedure Laterality Date   LEFT HEART CATH AND CORONARY ANGIOGRAPHY N/A 05/03/2017   Procedure: LEFT HEART CATH AND CORONARY ANGIOGRAPHY;  Surgeon: Lucendia Rusk, MD;  Location: [facility];  Service: Cardiovascular;  Laterality: N/A;   MENISCUS REPAIR Left 02/01/2023   Procedure: REPAIR OF LATERAL  MENISCUS;  Surgeon: Janeth Medicus, MD;  Location: [facility];  Service: Orthopedics;  Laterality: Left;   SEPTOPLASTY  05/2021   Social History   Social  History Narrative   Not on file   There is no immunization history for the selected administration types on file for this patient.   Objective: Vital Signs: BP 113/72 (BP Location: Left Arm, Patient Position: Sitting, Cuff Size: Normal)   Pulse 64   Resp 16   Ht 5\' 7"  (1.702 m)   Wt 214 lb (97.1 kg)   BMI 33.52 kg/m    Physical Exam Vitals and nursing note reviewed.  Constitutional:      Appearance: She is well-developed.  HENT:     Head: Normocephalic and atraumatic.  Eyes:     Conjunctiva/sclera: Conjunctivae normal.   Cardiovascular:     Rate and Rhythm: Normal rate and regular rhythm.     Heart sounds: Normal heart sounds.  Pulmonary:     Effort: Pulmonary effort is normal.     Breath sounds: Normal breath sounds.  Abdominal:     General: Bowel sounds are normal.     Palpations: Abdomen is soft.  Musculoskeletal:     Cervical back: Normal range of motion.  Lymphadenopathy:     Cervical: No cervical adenopathy.  Skin:    General: Skin is warm and dry.     Capillary Refill: Capillary refill takes less than 2 seconds.  Neurological:     Mental Status: She is alert and oriented to person, place, and time.  Psychiatric:        Behavior: Behavior normal.      Musculoskeletal Exam: Cervical, thoracic and lumbar spine with good range of motion.  Shoulders, elbows, wrists, MCPs PIPs and DIPs with good range of motion with no synovitis.  Hip joints, knee joints, ankles, MTPs and PIPs with good range of motion without any warmth swelling or effusion.  CDAI Exam: CDAI Score: -- Patient Global: --; Provider Global: -- Swollen: --; Tender: -- Joint Exam 09/12/2023   No joint exam has been documented for this visit   There is currently no information documented on the homunculus. Go to the Rheumatology activity and complete the homunculus joint exam.  Investigation: No additional findings.  Imaging: No results found.  Recent Labs: Lab Results  Component Value Date   WBC 9.4 01/24/2023   HGB 13.0 01/24/2023   PLT 335 01/24/2023   NA 138 01/24/2023   K 4.1 01/24/2023   CL 107 01/24/2023   CO2 24 01/24/2023   GLUCOSE 78 01/24/2023   BUN 12 01/24/2023   CREATININE 0.43 (L) 01/24/2023   BILITOT 0.4 09/12/2022   ALKPHOS 64 07/21/2019   AST 15 09/12/2022   ALT 14 09/12/2022   PROT 6.7 09/12/2022   ALBUMIN 3.9 07/21/2019   CALCIUM  8.6 (L) 01/24/2023   GFRAA >60 07/21/2019     Speciality Comments: No specialty comments available.  Procedures:  No procedures performed Allergies:  Patient has no known allergies.   Assessment / Plan:     Visit Diagnoses: Positive ANA (antinuclear antibody) -patient is positive ANA and continues to have fatigue.  She denies any history of oral ulcers, nasal ulcers, sicca symptoms, malar rash, photosensitivity, hair loss, inflammatory arthritis, Raynaud's or lymphadenopathy.  Patient continues to have fatigue but no other symptoms.  Will recheck labs today.  Sep 12, 2022 CBC normal, CMP normal, C3-C4 normal, double-stranded DNA negative, sed rate 6, ANA 1: 320 NS, urine protein creatinine ratio normal.  Will call her back with the lab results.  I advised her to contact us  if she develops any new symptoms.  Chronic pain of both knees -  History of knee joint stiffness.  A handout on exercises was given at the last visit.  Patient states her knee joint discomfort has improved.  Degeneration of intervertebral disc of lumbar region without discogenic back pain or lower extremity pain-she had good mobility in her lumbar spine today.  She denies any discomfort currently.  Other medical problems are listed as follows:  History of ST elevation myocardial infarction (STEMI) - [facility] in 2018  Coronary artery dissection  Dyslipidemia  Other fatigue  Hypersomnia  Anxiety and depression  History of gastroesophageal reflux (GERD)  History of asthma  Former smoker - 1 pack/day for 10 years.  She quit smoking in 2013.  She gets secondary smoking from her boyfriend.  Orders: Orders Placed This Encounter  Procedures   Protein / creatinine ratio, urine   CBC with Differential/Platelet   Comprehensive metabolic panel with GFR   Anti-DNA antibody, double-stranded   C3 and C4   Sedimentation rate   ANA   No orders of the defined types were placed in this encounter.  .  Follow-Up Instructions: Return in about 1 year (around 09/11/2024).   Nicholas Bari, MD  Note - This record has been created using Animal nutritionist.  Chart creation  errors have been sought, but may not always  have been located. Such creation errors do not reflect on  the standard of medical care.

## 2023-09-11 ENCOUNTER — Other Ambulatory Visit: Payer: Self-pay | Admitting: Family Medicine

## 2023-09-11 DIAGNOSIS — Z1231 Encounter for screening mammogram for malignant neoplasm of breast: Secondary | ICD-10-CM

## 2023-09-12 ENCOUNTER — Encounter: Payer: Self-pay | Admitting: Rheumatology

## 2023-09-12 ENCOUNTER — Ambulatory Visit: Payer: Medicaid Other | Attending: Rheumatology | Admitting: Rheumatology

## 2023-09-12 VITALS — BP 113/72 | HR 64 | Resp 16 | Ht 67.0 in | Wt 214.0 lb

## 2023-09-12 DIAGNOSIS — F32A Depression, unspecified: Secondary | ICD-10-CM | POA: Insufficient documentation

## 2023-09-12 DIAGNOSIS — R768 Other specified abnormal immunological findings in serum: Secondary | ICD-10-CM | POA: Insufficient documentation

## 2023-09-12 DIAGNOSIS — Z8719 Personal history of other diseases of the digestive system: Secondary | ICD-10-CM | POA: Insufficient documentation

## 2023-09-12 DIAGNOSIS — M25562 Pain in left knee: Secondary | ICD-10-CM | POA: Diagnosis present

## 2023-09-12 DIAGNOSIS — G4710 Hypersomnia, unspecified: Secondary | ICD-10-CM | POA: Insufficient documentation

## 2023-09-12 DIAGNOSIS — Z8709 Personal history of other diseases of the respiratory system: Secondary | ICD-10-CM | POA: Diagnosis present

## 2023-09-12 DIAGNOSIS — F419 Anxiety disorder, unspecified: Secondary | ICD-10-CM | POA: Diagnosis present

## 2023-09-12 DIAGNOSIS — M25561 Pain in right knee: Secondary | ICD-10-CM | POA: Diagnosis present

## 2023-09-12 DIAGNOSIS — M51369 Other intervertebral disc degeneration, lumbar region without mention of lumbar back pain or lower extremity pain: Secondary | ICD-10-CM | POA: Diagnosis present

## 2023-09-12 DIAGNOSIS — E785 Hyperlipidemia, unspecified: Secondary | ICD-10-CM | POA: Insufficient documentation

## 2023-09-12 DIAGNOSIS — I252 Old myocardial infarction: Secondary | ICD-10-CM | POA: Insufficient documentation

## 2023-09-12 DIAGNOSIS — R5383 Other fatigue: Secondary | ICD-10-CM | POA: Diagnosis present

## 2023-09-12 DIAGNOSIS — G8929 Other chronic pain: Secondary | ICD-10-CM | POA: Insufficient documentation

## 2023-09-12 DIAGNOSIS — I2542 Coronary artery dissection: Secondary | ICD-10-CM | POA: Diagnosis present

## 2023-09-12 DIAGNOSIS — Z87891 Personal history of nicotine dependence: Secondary | ICD-10-CM | POA: Diagnosis present

## 2023-09-14 LAB — COMPREHENSIVE METABOLIC PANEL WITH GFR
AG Ratio: 2 (calc) (ref 1.0–2.5)
ALT: 13 U/L (ref 6–29)
AST: 12 U/L (ref 10–30)
Albumin: 4.1 g/dL (ref 3.6–5.1)
Alkaline phosphatase (APISO): 86 U/L (ref 31–125)
BUN: 16 mg/dL (ref 7–25)
CO2: 27 mmol/L (ref 20–32)
Calcium: 8.7 mg/dL (ref 8.6–10.2)
Chloride: 103 mmol/L (ref 98–110)
Creat: 0.57 mg/dL (ref 0.50–0.99)
Globulin: 2.1 g/dL (ref 1.9–3.7)
Glucose, Bld: 126 mg/dL — ABNORMAL HIGH (ref 65–99)
Potassium: 4 mmol/L (ref 3.5–5.3)
Sodium: 136 mmol/L (ref 135–146)
Total Bilirubin: 0.6 mg/dL (ref 0.2–1.2)
Total Protein: 6.2 g/dL (ref 6.1–8.1)
eGFR: 117 mL/min/{1.73_m2} (ref >=60)

## 2023-09-14 LAB — CBC WITH DIFFERENTIAL/PLATELET
Absolute Lymphocytes: 2000 {cells}/uL (ref 850–3900)
Absolute Monocytes: 634 {cells}/uL (ref 200–950)
Basophils Absolute: 20 {cells}/uL (ref 0–200)
Basophils Relative: 0.2 %
Eosinophils Absolute: 594 {cells}/uL — ABNORMAL HIGH (ref 15–500)
Eosinophils Relative: 6 %
HCT: 38.4 % (ref 35.0–45.0)
Hemoglobin: 12.5 g/dL (ref 11.7–15.5)
MCH: 29.6 pg (ref 27.0–33.0)
MCHC: 32.6 g/dL (ref 32.0–36.0)
MCV: 90.8 fL (ref 80.0–100.0)
MPV: 10.4 fL (ref 7.5–12.5)
Monocytes Relative: 6.4 %
Neutro Abs: 6653 {cells}/uL (ref 1500–7800)
Neutrophils Relative %: 67.2 %
Platelets: 331 10*3/uL (ref 140–400)
RBC: 4.23 10*6/uL (ref 3.80–5.10)
RDW: 13 % (ref 11.0–15.0)
Total Lymphocyte: 20.2 %
WBC: 9.9 10*3/uL (ref 3.8–10.8)

## 2023-09-14 LAB — ANTI-NUCLEAR AB-TITER (ANA TITER): ANA Titer 1: 1:320 {titer} — ABNORMAL HIGH

## 2023-09-14 LAB — PROTEIN / CREATININE RATIO, URINE
Creatinine, Urine: 162 mg/dL (ref 20–275)
Protein/Creat Ratio: 111 mg/g{creat} (ref 24–184)
Protein/Creatinine Ratio: 0.111 mg/mg{creat} (ref 0.024–0.184)
Total Protein, Urine: 18 mg/dL (ref 5–24)

## 2023-09-14 LAB — C3 AND C4
C3 Complement: 132 mg/dL (ref 83–193)
C4 Complement: 27 mg/dL (ref 15–57)

## 2023-09-14 LAB — SEDIMENTATION RATE: Sed Rate: 9 mm/h (ref 0–20)

## 2023-09-14 LAB — ANA: Anti Nuclear Antibody (ANA): POSITIVE — AB

## 2023-09-14 LAB — ANTI-DNA ANTIBODY, DOUBLE-STRANDED: ds DNA Ab: <1 [IU]/mL

## 2023-09-16 NOTE — Progress Notes (Signed)
 CBC and CMP normal except glucose is mildly elevated, probably not a fasting sample.  ANA positive, dsDNA negative, complements normal, sed rate normal, urine protein creatinine ratio normal.  Labs do not indicate an active autoimmune disease.

## 2023-09-18 ENCOUNTER — Ambulatory Visit
Admission: RE | Admit: 2023-09-18 | Discharge: 2023-09-18 | Disposition: A | Discharge status: Home / Self Care | Source: Ambulatory Visit | Attending: Family Medicine | Admitting: Family Medicine

## 2023-09-18 DIAGNOSIS — Z1231 Encounter for screening mammogram for malignant neoplasm of breast: Secondary | ICD-10-CM

## 2023-10-13 ENCOUNTER — Other Ambulatory Visit: Payer: Self-pay | Admitting: Medical Genetics

## 2023-10-15 ENCOUNTER — Encounter

## 2023-11-11 ENCOUNTER — Other Ambulatory Visit (HOSPITAL_COMMUNITY)
Admission: RE | Admit: 2023-11-11 | Discharge: 2023-11-11 | Disposition: A | Discharge status: Home / Self Care | Payer: Self-pay | Source: Ambulatory Visit | Attending: Medical Genetics | Admitting: Medical Genetics

## 2023-11-22 LAB — GENECONNECT MOLECULAR SCREEN: Genetic Analysis Overall Interpretation: NEGATIVE

## 2024-09-11 ENCOUNTER — Ambulatory Visit: Admitting: Rheumatology
